# Patient Record
Sex: Female | Born: 1970 | Race: White | Hispanic: No | Marital: Married | Smoking: Never smoker
Health system: Southern US, Community
[De-identification: ages and names within clinical notes are randomized; demographics above are authoritative.]

## PROBLEM LIST (undated history)

## (undated) DIAGNOSIS — D649 Anemia, unspecified: Secondary | ICD-10-CM

## (undated) DIAGNOSIS — G43909 Migraine, unspecified, not intractable, without status migrainosus: Secondary | ICD-10-CM

## (undated) DIAGNOSIS — E079 Disorder of thyroid, unspecified: Secondary | ICD-10-CM

## (undated) HISTORY — DX: Disorder of thyroid, unspecified: E07.9

## (undated) HISTORY — DX: Anemia, unspecified: D64.9

## (undated) HISTORY — DX: Migraine, unspecified, not intractable, without status migrainosus: G43.909

---

## 2008-09-09 ENCOUNTER — Ambulatory Visit: Payer: Self-pay | Admitting: Family Medicine

## 2008-10-26 ENCOUNTER — Ambulatory Visit: Payer: Self-pay

## 2011-07-06 ENCOUNTER — Emergency Department: Payer: Self-pay | Admitting: *Deleted

## 2011-07-06 LAB — PREGNANCY, URINE: Pregnancy Test, Urine: NEGATIVE m[IU]/mL

## 2011-07-06 LAB — URINALYSIS, COMPLETE
Bilirubin,UR: NEGATIVE
Ketone: NEGATIVE
Leukocyte Esterase: NEGATIVE
Ph: 6 (ref 4.5–8.0)
Specific Gravity: 1.003 (ref 1.003–1.030)

## 2016-02-06 ENCOUNTER — Telehealth: Payer: Self-pay

## 2016-02-06 NOTE — Telephone Encounter (Signed)
Patient called to see if she can get re established and to see Dr Caryn Section. She states she use to see Dr Venia Minks and last time she was here she thinks was 2010. I do not see anything in harmony in her chart. She states her husband, Ulyses Amor sees you. She states she has nodules on thyroid and will need referral. She has Medicaid.CB: [id number]

## 2016-02-06 NOTE — Telephone Encounter (Signed)
That's fine

## 2016-02-08 NOTE — Telephone Encounter (Signed)
Appointment scheduled for 02/20/16@4 :00/MW

## 2016-02-20 ENCOUNTER — Encounter: Payer: Self-pay | Admitting: Family Medicine

## 2016-02-20 ENCOUNTER — Ambulatory Visit (INDEPENDENT_AMBULATORY_CARE_PROVIDER_SITE_OTHER): Payer: Medicaid Other | Admitting: Family Medicine

## 2016-02-20 VITALS — BP 120/70 | HR 106 | Temp 98.8°F | Resp 16 | Ht 65.5 in | Wt 122.0 lb

## 2016-02-20 DIAGNOSIS — E041 Nontoxic single thyroid nodule: Secondary | ICD-10-CM

## 2016-02-20 DIAGNOSIS — D509 Iron deficiency anemia, unspecified: Secondary | ICD-10-CM | POA: Diagnosis not present

## 2016-02-20 DIAGNOSIS — Z800 Family history of malignant neoplasm of digestive organs: Secondary | ICD-10-CM

## 2016-02-20 NOTE — Progress Notes (Signed)
       Patient: Jasmin Christian Female    DOB: 09-27-1970   46 y.o.   MRN: [id number] Visit Date: 02/20/2016  Today's Provider: Lelon Huh, MD   Chief Complaint  Patient presents with  . Establish Care   Subjective:    HPI Patient is here to establish transferring from [facility] but has recently gotten on Medicaid. She is the Spouse of [employer]. Has had thyroid  nodules since at least 2010 and had several follow up ultrasounds since then. Thyroid functions have been monitored at [facility] which have been mostly normal, but had one mildly elevated TSH last fall.   Was also found to have microcytic hypochromic anemia with hemoglobin of 7.8 on routine labs at [facility] in September. She states she has history of anemia in all of her pregnancies. She states she had been having extreme cravings for ice and was very cold natured prior to starting iron supplement in September, but these symptoms have mostly resolve. She states she is perimenopausal which she thinks ay be contributing to anemia She states her father died of colon cancer at the age of 27. She states she has had no change in bowel habits or stool consistently.   Reports last pap was 2013 and last mammogram was 2010.    No Known Allergies   Current Outpatient Prescriptions:  .  ferrous sulfate 325 (65 FE) MG tablet, Take 325 mg by mouth 3 (three) times daily with meals., Disp: , Rfl:   Review of Systems  Constitutional: Negative for appetite change, chills, fatigue and fever.  Respiratory: Negative for chest tightness and shortness of breath.   Cardiovascular: Negative for chest pain and palpitations.  Gastrointestinal: Negative for abdominal pain, nausea and vomiting.  Neurological: Negative for dizziness and weakness.    Social History  Substance Use Topics  . Smoking status: Never Smoker  . Smokeless tobacco: Never Used  . Alcohol use No   Objective:   BP 120/70 (BP Location: Left Arm, Patient Position:  Sitting, Cuff Size: Normal)   Pulse (!) 106   Temp 98.8 F (37.1 C) (Oral)   Resp 16   Ht 5' 5.5" (1.664 m)   Wt 122 lb (55.3 kg)   LMP 02/02/2016   SpO2 99%   BMI 19.99 kg/m   Physical Exam  General appearance: alert, well developed, well nourished, cooperative and in no distress Head: Normocephalic, without obvious abnormality, atraumatic Respiratory: Respirations even and unlabored, normal respiratory rate Extremities: No gross deformities Skin: Skin color, texture, turgor normal. No rashes seen  Psych: Appropriate mood and affect. Neurologic: Mental status: Alert, oriented to person, place, and time, thought content appropriate.     Assessment & Plan:     1. Iron deficiency anemia, unspecified iron deficiency anemia type Is feeling better since starting TID iron supplements, but needs GI visualization.  - Ambulatory referral to Gastroenterology - CBC  2. Family history of colon cancer  - Ambulatory referral to Gastroenterology  3. Thyroid nodule  - Ambulatory referral to Endocrinology         Lelon Huh, MD  [facility]

## 2016-02-21 LAB — CBC
Hematocrit: 38.7 % (ref 34.0–46.6)
Hemoglobin: 12.6 g/dL (ref 11.1–15.9)
MCH: 28 pg (ref 26.6–33.0)
MCHC: 32.6 g/dL (ref 31.5–35.7)
MCV: 86 fL (ref 79–97)
PLATELETS: 266 10*3/uL (ref 150–379)
RBC: 4.5 x10E6/uL (ref 3.77–5.28)
RDW: 15.2 % (ref 12.3–15.4)
WBC: 6.4 10*3/uL (ref 3.4–10.8)

## 2016-02-23 ENCOUNTER — Telehealth: Payer: Self-pay | Admitting: Family Medicine

## 2016-02-23 MED ORDER — FERROUS SULFATE 325 (65 FE) MG PO TABS: 325.0000 mg | ORAL_TABLET | Freq: Two times a day (BID) | ORAL | 3 refills | Status: DC

## 2016-02-23 NOTE — Telephone Encounter (Signed)
Please advise 

## 2016-02-23 NOTE — Telephone Encounter (Signed)
Pt wants to get a reill on the iron rx that she use to take .  She said the last time she had her iron checked Dr. Caryn Section told her to take the over the counter but she couldn't fine the right mg.  She ask that she be put back on the rx she use to take.  [facility] [address]  Pt's call back is [phone]  Thanks Con Memos

## 2016-03-11 ENCOUNTER — Telehealth: Payer: Self-pay | Admitting: Gastroenterology

## 2016-03-11 NOTE — Telephone Encounter (Signed)
Patient is returning a call regarding scheduling a colonoscopy

## 2016-03-20 ENCOUNTER — Telehealth: Payer: Self-pay

## 2016-03-20 ENCOUNTER — Other Ambulatory Visit: Payer: Self-pay

## 2016-03-20 NOTE — Telephone Encounter (Signed)
Gastroenterology Pre-Procedure Review  Request Date:  Requesting Physician: Dr.   PATIENT REVIEW QUESTIONS: The patient responded to the following health history questions as indicated:    1. Are you having any GI issues? no 2. Do you have a personal history of Polyps? no 3. Do you have a family history of Colon Cancer or Polyps? no 4. Diabetes Mellitus? no 5. Joint replacements in the past 12 months?no 6. Major health problems in the past 3 months?no 7. Any artificial heart valves, MVP, or defibrillator?no    MEDICATIONS & ALLERGIES:    Patient reports the following regarding taking any anticoagulation/antiplatelet therapy:   Plavix, Coumadin, Eliquis, Xarelto, Lovenox, Pradaxa, Brilinta, or Effient? no Aspirin? no  Patient confirms/reports the following medications:  Current Outpatient Prescriptions  Medication Sig Dispense Refill  . ferrous sulfate 325 (65 FE) MG tablet Take 1 tablet (325 mg total) by mouth 2 (two) times daily with a meal. 180 tablet 3   No current facility-administered medications for this visit.     Patient confirms/reports the following allergies:  No Known Allergies  No orders of the defined types were placed in this encounter.   AUTHORIZATION INFORMATION Primary Insurance: 1D#: Group #:  Secondary Insurance: 1D#: Group #:  SCHEDULE INFORMATION: Date: 04/09/16 Time: Location: [place]

## 2016-04-03 ENCOUNTER — Encounter: Payer: Self-pay | Admitting: Family Medicine

## 2016-04-11 ENCOUNTER — Telehealth: Payer: Self-pay | Admitting: Gastroenterology

## 2016-04-11 NOTE — Telephone Encounter (Signed)
Patient left a voice message that she needs to reschedule her colonoscopy on 3/6

## 2016-04-12 NOTE — Telephone Encounter (Signed)
LVM for pt to return my call.

## 2016-04-16 ENCOUNTER — Ambulatory Visit: Admission: RE | Admit: 2016-04-16 | Payer: Medicaid Other | Source: Ambulatory Visit | Admitting: Gastroenterology

## 2016-04-16 ENCOUNTER — Encounter: Admission: RE | Payer: Self-pay | Source: Ambulatory Visit

## 2016-04-16 ENCOUNTER — Other Ambulatory Visit: Payer: Self-pay

## 2016-04-16 DIAGNOSIS — Z800 Family history of malignant neoplasm of digestive organs: Secondary | ICD-10-CM

## 2016-04-16 SURGERY — COLONOSCOPY WITH PROPOFOL
Anesthesia: General

## 2016-04-26 ENCOUNTER — Encounter: Payer: Self-pay | Admitting: Family Medicine

## 2016-05-07 ENCOUNTER — Encounter: Payer: Self-pay | Admitting: *Deleted

## 2016-05-07 ENCOUNTER — Ambulatory Visit
Admission: RE | Admit: 2016-05-07 | Discharge: 2016-05-07 | Disposition: A | Discharge status: Home / Self Care | Payer: Medicaid Other | Source: Ambulatory Visit | Attending: Gastroenterology | Admitting: Gastroenterology

## 2016-05-07 ENCOUNTER — Ambulatory Visit: Payer: Medicaid Other | Admitting: Anesthesiology

## 2016-05-07 ENCOUNTER — Encounter
Admission: RE | Disposition: A | Discharge status: Home / Self Care | Payer: Self-pay | Source: Ambulatory Visit | Attending: Gastroenterology

## 2016-05-07 DIAGNOSIS — Z1211 Encounter for screening for malignant neoplasm of colon: Secondary | ICD-10-CM | POA: Insufficient documentation

## 2016-05-07 DIAGNOSIS — D649 Anemia, unspecified: Secondary | ICD-10-CM | POA: Insufficient documentation

## 2016-05-07 DIAGNOSIS — E079 Disorder of thyroid, unspecified: Secondary | ICD-10-CM | POA: Diagnosis not present

## 2016-05-07 DIAGNOSIS — D120 Benign neoplasm of cecum: Secondary | ICD-10-CM | POA: Insufficient documentation

## 2016-05-07 DIAGNOSIS — Z800 Family history of malignant neoplasm of digestive organs: Secondary | ICD-10-CM

## 2016-05-07 DIAGNOSIS — K641 Second degree hemorrhoids: Secondary | ICD-10-CM | POA: Insufficient documentation

## 2016-05-07 DIAGNOSIS — Z79899 Other long term (current) drug therapy: Secondary | ICD-10-CM | POA: Insufficient documentation

## 2016-05-07 DIAGNOSIS — Z86010 Personal history of colonic polyps: Secondary | ICD-10-CM | POA: Insufficient documentation

## 2016-05-07 HISTORY — PX: COLONOSCOPY WITH PROPOFOL: SHX5780

## 2016-05-07 LAB — POCT PREGNANCY, URINE: Preg Test, Ur: NEGATIVE

## 2016-05-07 SURGERY — COLONOSCOPY WITH PROPOFOL
Anesthesia: General

## 2016-05-07 MED ORDER — PROPOFOL 500 MG/50ML IV EMUL
INTRAVENOUS | Status: DC | PRN
  Administered 2016-05-07: 100 ug/kg/min via INTRAVENOUS

## 2016-05-07 MED ORDER — PHENYLEPHRINE HCL 10 MG/ML IJ SOLN
INTRAMUSCULAR | Status: DC | PRN
  Administered 2016-05-07: 100 ug via INTRAVENOUS

## 2016-05-07 MED ORDER — SODIUM CHLORIDE 0.9 % IV SOLN
INTRAVENOUS | Status: DC
  Administered 2016-05-07: 08:00:00 via INTRAVENOUS

## 2016-05-07 MED ORDER — PROPOFOL 10 MG/ML IV BOLUS
INTRAVENOUS | Status: DC | PRN
  Administered 2016-05-07: 30 mg via INTRAVENOUS
  Administered 2016-05-07: 80 mg via INTRAVENOUS

## 2016-05-07 MED ORDER — PROPOFOL 500 MG/50ML IV EMUL
INTRAVENOUS | Status: AC
Start: 2016-05-07 — End: 2016-05-07
  Filled 2016-05-07: qty 50

## 2016-05-07 NOTE — Anesthesia Postprocedure Evaluation (Signed)
Anesthesia Post Note  Patient: Jasmin Christian  Procedure(s) Performed: Procedure(s) (LRB): COLONOSCOPY WITH PROPOFOL (N/A)  Patient location during evaluation: Endoscopy Anesthesia Type: General Level of consciousness: awake and alert Pain management: pain level controlled Vital Signs Assessment: post-procedure vital signs reviewed and stable Respiratory status: spontaneous breathing, nonlabored ventilation, respiratory function stable and patient connected to nasal cannula oxygen Cardiovascular status: blood pressure returned to baseline and stable Postop Assessment: no signs of nausea or vomiting Anesthetic complications: no     Last Vitals:  Vitals:   05/07/16 0838 05/07/16 0848  BP: (!) 95/55 96/61  Pulse: 67 73  Resp: 15 14  Temp:      Last Pain:  Vitals:   05/07/16 0848  TempSrc:   PainSc: 0-No pain                 Martha Clan

## 2016-05-07 NOTE — H&P (Signed)
  Lucilla Lame, MD [facility] [address], [place], [place] [address] Phone:[phone] Fax : [phone]  Primary Care Physician:  Lelon Huh, MD Primary Gastroenterologist:  Dr. Allen Norris  Pre-Procedure History & Physical: HPI:  NELDA LUCKEY is a 46 y.o. female is here for an colonoscopy.   Past Medical History:  Diagnosis Date  . Anemia   . Migraine   . Thyroid disease     History reviewed. No pertinent surgical history.  Prior to Admission medications   Medication Sig Start Date End Date Taking? Authorizing Provider  Cholecalciferol (VITAMIN D3) 2000 units capsule Take 2,000 Units by mouth daily.   Yes Historical Provider, MD  ferrous sulfate 325 (65 FE) MG tablet Take 1 tablet (325 mg total) by mouth 2 (two) times daily with a meal. 02/23/16  Yes Birdie Sons, MD  selenium 50 MCG TABS tablet Take 50 mcg by mouth daily.   Yes Historical Provider, MD    Allergies as of 04/16/2016  . (No Known Allergies)    Family History  Problem Relation Age of Onset  . Heart disease Father   . Colon cancer Father     Social History   Social History  . Marital status: Married    Spouse name: N/A  . Number of children: N/A  . Years of education: N/A   Occupational History  . Not on file.   Social History Main Topics  . Smoking status: Never Smoker  . Smokeless tobacco: Never Used  . Alcohol use No  . Drug use: No  . Sexual activity: Not on file   Other Topics Concern  . Not on file   Social History Narrative  . No narrative on file    Review of Systems: See HPI, otherwise negative ROS  Physical Exam: BP 101/76   Pulse 86   Temp 98.6 F (37 C) (Tympanic)   Resp 16   Ht 5' 5.5" (1.664 m)   Wt 120 lb (54.4 kg)   LMP 05/05/2016   SpO2 100%   BMI 19.67 kg/m  General:   Alert,  pleasant and cooperative in NAD Head:  Normocephalic and atraumatic. Neck:  Supple; no masses or thyromegaly. Lungs:  Clear throughout to auscultation.    Heart:  Regular  rate and rhythm. Abdomen:  Soft, nontender and nondistended. Normal bowel sounds, without guarding, and without rebound.   Neurologic:  Alert and  oriented x4;  grossly normal neurologically.  Impression/Plan: BIRD TAILOR is here for an colonoscopy to be performed for family history of colon cancer  Risks, benefits, limitations, and alternatives regarding  colonoscopy have been reviewed with the patient.  Questions have been answered.  All parties agreeable.   Lucilla Lame, MD  05/07/2016, 7:47 AM

## 2016-05-07 NOTE — Op Note (Signed)
[facility] Gastroenterology Patient Name: Jasmin Christian Procedure Date: 05/07/2016 7:17 AM MRN: [id number] Account #: [id number] Date of Birth: [birth year] Admit Type: Outpatient Age: 46 Room: [facility] ENDO ROOM 4 Gender: Female Note Status: Finalized Procedure:            Colonoscopy Indications:          Screening in patient at increased risk: Family history                        of 1st-degree relative with colorectal cancer before                        age 63 years Providers:            Lucilla Lame MD, MD Medicines:            Propofol per Anesthesia Complications:        No immediate complications. Procedure:            Pre-Anesthesia Assessment:                       - Prior to the procedure, a History and Physical was                        performed, and patient medications and allergies were                        reviewed. The patient's tolerance of previous                        anesthesia was also reviewed. The risks and benefits of                        the procedure and the sedation options and risks were                        discussed with the patient. All questions were                        answered, and informed consent was obtained. Prior                        Anticoagulants: The patient has taken no previous                        anticoagulant or antiplatelet agents. ASA Grade                        Assessment: II - A patient with mild systemic disease.                        After reviewing the risks and benefits, the patient was                        deemed in satisfactory condition to undergo the                        procedure.                       After obtaining informed consent, the  colonoscope was                        passed under direct vision. Throughout the procedure,                        the patient's blood pressure, pulse, and oxygen                        saturations were monitored continuously. The Olympus                         CF-HQ190L Colonoscope (S#. [id number]) was introduced                        through the anus and advanced to the the cecum,                        identified by appendiceal orifice and ileocecal valve.                        The colonoscopy was performed without difficulty. The                        patient tolerated the procedure well. The quality of                        the bowel preparation was excellent. Findings:      The perianal and digital rectal examinations were normal.      A 5 mm polyp was found in the cecum. The polyp was sessile. The polyp       was removed with a cold snare. Resection and retrieval were complete.      Non-bleeding internal hemorrhoids were found during retroflexion. The       hemorrhoids were Grade II (internal hemorrhoids that prolapse but reduce       spontaneously). Impression:           - One 5 mm polyp in the cecum, removed with a cold                        snare. Resected and retrieved.                       - Non-bleeding internal hemorrhoids. Recommendation:       - Discharge patient to home.                       - Resume previous diet.                       - Continue present medications.                       - Repeat colonoscopy in 5 years for surveillance. Procedure Code(s):    --- Professional ---                       [phone], Colonoscopy, flexible; with removal of tumor(s),                        polyp(s), or other lesion(s) by snare technique Diagnosis Code(s):    ---  Professional ---                       Z80.0, Family history of malignant neoplasm of                        digestive organs                       D12.0, Benign neoplasm of cecum CPT copyright 2016 American Medical Association. All rights reserved. The codes documented in this report are preliminary and upon coder review may  be revised to meet current compliance requirements. Lucilla Lame MD, MD 05/07/2016 8:14:30 AM This report has been signed  electronically. Number of Addenda: 0 Note Initiated On: 05/07/2016 7:17 AM Scope Withdrawal Time: 0 hours 7 minutes 5 seconds  Total Procedure Duration: 0 hours 14 minutes 13 seconds       [facility]

## 2016-05-07 NOTE — Anesthesia Post-op Follow-up Note (Cosign Needed)
Anesthesia QCDR form completed.        

## 2016-05-07 NOTE — Anesthesia Preprocedure Evaluation (Signed)
Anesthesia Evaluation  Patient identified by MRN, date of birth, ID band Patient awake    Reviewed: Allergy & Precautions, H&P , NPO status , Patient's Chart, lab work & pertinent test results, reviewed documented beta blocker date and time   History of Anesthesia Complications Negative for: history of anesthetic complications  Airway Mallampati: II  TM Distance: >3 FB Neck ROM: full    Dental   Bridge:   Pulmonary neg pulmonary ROS,           Cardiovascular Exercise Tolerance: Good negative cardio ROS       Neuro/Psych negative neurological ROS  negative psych ROS   GI/Hepatic negative GI ROS, Neg liver ROS,   Endo/Other  neg diabetesHypothyroidism   Renal/GU negative Renal ROS  negative genitourinary   Musculoskeletal   Abdominal   Peds  Hematology  (+) anemia ,   Anesthesia Other Findings Past Medical History: No date: Anemia No date: Migraine No date: Thyroid disease   Reproductive/Obstetrics negative OB ROS                             Anesthesia Physical Anesthesia Plan  ASA: II  Anesthesia Plan: General   Post-op Pain Management:    Induction:   Airway Management Planned:   Additional Equipment:   Intra-op Plan:   Post-operative Plan:   Informed Consent: I have reviewed the patients History and Physical, chart, labs and discussed the procedure including the risks, benefits and alternatives for the proposed anesthesia with the patient or authorized representative who has indicated his/her understanding and acceptance.   Dental Advisory Given  Plan Discussed with: Anesthesiologist, CRNA and Surgeon  Anesthesia Plan Comments:         Anesthesia Quick Evaluation

## 2016-05-07 NOTE — Transfer of Care (Signed)
Immediate Anesthesia Transfer of Care Note  Patient: Jasmin Christian  Procedure(s) Performed: Procedure(s): COLONOSCOPY WITH PROPOFOL (N/A)  Patient Location: PACU and Endoscopy Unit  Anesthesia Type:General  Level of Consciousness: awake, alert  and oriented  Airway & Oxygen Therapy: Patient Spontanous Breathing and Patient connected to nasal cannula oxygen  Post-op Assessment: Report given to RN and Post -op Vital signs reviewed and stable  Post vital signs: Reviewed and stable  Last Vitals:  Vitals:   05/07/16 0818 05/07/16 0819  BP: (!) 102/44 (!) 102/44  Pulse: 63 66  Resp: 15 16  Temp: 36.2 C     Last Pain:  Vitals:   05/07/16 0818  TempSrc: Tympanic         Complications: No apparent anesthesia complications

## 2016-05-08 ENCOUNTER — Encounter: Payer: Self-pay | Admitting: Family Medicine

## 2016-05-08 LAB — SURGICAL PATHOLOGY

## 2016-05-09 ENCOUNTER — Encounter: Payer: Self-pay | Admitting: Gastroenterology

## 2018-12-04 ENCOUNTER — Telehealth: Payer: Self-pay

## 2018-12-04 NOTE — Telephone Encounter (Signed)
Pre-visit screening call attempted prior to [facility] appointment on 12/08/2018. No answer / left a msg

## 2018-12-08 ENCOUNTER — Ambulatory Visit
Admission: RE | Admit: 2018-12-08 | Discharge: 2018-12-08 | Disposition: A | Discharge status: Home / Self Care | Payer: Medicaid Other | Source: Ambulatory Visit | Attending: Oncology | Admitting: Oncology

## 2018-12-08 ENCOUNTER — Ambulatory Visit: Payer: Medicaid Other | Attending: Oncology | Admitting: *Deleted

## 2018-12-08 ENCOUNTER — Other Ambulatory Visit: Payer: Self-pay

## 2018-12-08 VITALS — BP 100/68 | HR 72 | Temp 98.3°F | Ht 65.0 in | Wt 117.0 lb

## 2018-12-08 DIAGNOSIS — Z0000 Encounter for general adult medical examination without abnormal findings: Secondary | ICD-10-CM

## 2018-12-08 NOTE — Progress Notes (Signed)
  Subjective:     Patient ID: Jasmin Christian, female   DOB: 05/14/1970, 48 y.o.   MRN: [id number]  HPI   Review of Systems     Objective:   Physical Exam Chest:     Breasts:        Right: No swelling, bleeding, inverted nipple, mass, nipple discharge, skin change or tenderness.        Left: No swelling, bleeding, inverted nipple, mass, nipple discharge, skin change or tenderness.  Lymphadenopathy:     Upper Body:     Right upper body: No supraclavicular or axillary adenopathy.     Left upper body: No supraclavicular or axillary adenopathy.        Assessment:     48 year old White female presents to [facility] for clinical breast exam and mammogram.  Clinical breast exam unremarkable.  Taught self breast awareness.  Patient had recent pap on 11/03/18 that was negative without HPV co-testing.  Tyrer-Cuzick breast cancer risk assessment with a lifetime risk of 7.4%.  Per NCCN guidelines no further imaging or genetic testing is recommended.  Patient does meet NCCN guidelines for genetic testing based on family history of her colon cancer in her dad at age 37.  Discussed referral to high risk clinic for genetic testing.  States she cannot afford it at this time, although she is interested.  Discussed possibility of financial assistance through our [facility]  We are going to discuss with the Foundation.  Patient has been screened for eligibility.  She does not have any insurance, Medicare or Medicaid.  She also meets financial eligibility.  Hand-out given on the Affordable Care Act.     Plan:     Screening mammogram ordered.  Patient was given my number to follow back up in a couple of weeks to see if financial assistance is available.  Will follow up per [facility] protocol.

## 2018-12-08 NOTE — Patient Instructions (Signed)
Patient has been screened for eligibility.  She does not have any insurance, Medicare or Medicaid.  She also meets financial eligibility.  Hand-out given on the Affordable Care Act.

## 2018-12-09 ENCOUNTER — Encounter: Payer: Self-pay | Admitting: *Deleted

## 2018-12-10 ENCOUNTER — Encounter: Payer: Self-pay | Admitting: *Deleted

## 2020-06-04 ENCOUNTER — Emergency Department: Payer: Medicaid Other

## 2020-06-04 ENCOUNTER — Other Ambulatory Visit: Payer: Self-pay

## 2020-06-04 ENCOUNTER — Emergency Department
Admission: EM | Admit: 2020-06-04 | Discharge: 2020-06-04 | Disposition: A | Discharge status: Home / Self Care | Payer: Medicaid Other | Attending: Emergency Medicine | Admitting: Emergency Medicine

## 2020-06-04 DIAGNOSIS — R451 Restlessness and agitation: Secondary | ICD-10-CM | POA: Insufficient documentation

## 2020-06-04 DIAGNOSIS — F12929 Cannabis use, unspecified with intoxication, unspecified: Secondary | ICD-10-CM

## 2020-06-04 DIAGNOSIS — R000 Tachycardia, unspecified: Secondary | ICD-10-CM | POA: Diagnosis not present

## 2020-06-04 DIAGNOSIS — E079 Disorder of thyroid, unspecified: Secondary | ICD-10-CM | POA: Insufficient documentation

## 2020-06-04 DIAGNOSIS — F12129 Cannabis abuse with intoxication, unspecified: Secondary | ICD-10-CM | POA: Insufficient documentation

## 2020-06-04 DIAGNOSIS — T7840XA Allergy, unspecified, initial encounter: Secondary | ICD-10-CM | POA: Diagnosis not present

## 2020-06-04 DIAGNOSIS — R0602 Shortness of breath: Secondary | ICD-10-CM | POA: Insufficient documentation

## 2020-06-04 DIAGNOSIS — R457 State of emotional shock and stress, unspecified: Secondary | ICD-10-CM | POA: Diagnosis not present

## 2020-06-04 LAB — COMPREHENSIVE METABOLIC PANEL
ALT: 21 U/L (ref 0–44)
AST: 23 U/L (ref 15–41)
Albumin: 3.6 g/dL (ref 3.5–5.0)
Alkaline Phosphatase: 69 U/L (ref 38–126)
Anion gap: 9 (ref 5–15)
BUN: 12 mg/dL (ref 6–20)
CO2: 26 mmol/L (ref 22–32)
Calcium: 8.6 mg/dL — ABNORMAL LOW (ref 8.9–10.3)
Chloride: 104 mmol/L (ref 98–111)
Creatinine, Ser: 0.81 mg/dL (ref 0.44–1.00)
GFR, Estimated: >60 mL/min (ref >60)
Glucose, Bld: 161 mg/dL — ABNORMAL HIGH (ref 70–99)
Potassium: 3.5 mmol/L (ref 3.5–5.1)
Sodium: 139 mmol/L (ref 135–145)
Total Bilirubin: 0.5 mg/dL (ref 0.3–1.2)
Total Protein: 6.4 g/dL — ABNORMAL LOW (ref 6.5–8.1)

## 2020-06-04 LAB — CBC WITH DIFFERENTIAL/PLATELET
Abs Immature Granulocytes: 0.02 10*3/uL (ref 0.00–0.07)
Basophils Absolute: 0 10*3/uL (ref 0.0–0.1)
Basophils Relative: 0 %
Eosinophils Absolute: 0.1 10*3/uL (ref 0.0–0.5)
Eosinophils Relative: 2 %
HCT: 36.4 % (ref 36.0–46.0)
Hemoglobin: 11.9 g/dL — ABNORMAL LOW (ref 12.0–15.0)
Immature Granulocytes: 0 %
Lymphocytes Relative: 34 %
Lymphs Abs: 1.9 10*3/uL (ref 0.7–4.0)
MCH: 28.7 pg (ref 26.0–34.0)
MCHC: 32.7 g/dL (ref 30.0–36.0)
MCV: 87.7 fL (ref 80.0–100.0)
Monocytes Absolute: 0.5 10*3/uL (ref 0.1–1.0)
Monocytes Relative: 10 %
Neutro Abs: 2.9 10*3/uL (ref 1.7–7.7)
Neutrophils Relative %: 54 %
Platelets: 249 10*3/uL (ref 150–400)
RBC: 4.15 MIL/uL (ref 3.87–5.11)
RDW: 12.8 % (ref 11.5–15.5)
WBC: 5.5 10*3/uL (ref 4.0–10.5)
nRBC: 0 % (ref 0.0–0.2)

## 2020-06-04 LAB — URINE DRUG SCREEN, QUALITATIVE (ARMC ONLY)
Amphetamines, Ur Screen: NOT DETECTED
Barbiturates, Ur Screen: NOT DETECTED
Benzodiazepine, Ur Scrn: NOT DETECTED
Cannabinoid 50 Ng, Ur ~~LOC~~: POSITIVE — AB
Cocaine Metabolite,Ur ~~LOC~~: NOT DETECTED
MDMA (Ecstasy)Ur Screen: NOT DETECTED
Methadone Scn, Ur: NOT DETECTED
Opiate, Ur Screen: NOT DETECTED
Phencyclidine (PCP) Ur S: NOT DETECTED
Tricyclic, Ur Screen: NOT DETECTED

## 2020-06-04 LAB — ETHANOL: Alcohol, Ethyl (B): <10 mg/dL (ref <10)

## 2020-06-04 LAB — TROPONIN I (HIGH SENSITIVITY): Troponin I (High Sensitivity): <2 ng/L (ref <18)

## 2020-06-04 MED ORDER — LORAZEPAM 2 MG/ML IJ SOLN
1.0000 mg | Freq: Once | INTRAMUSCULAR | Status: AC
  Administered 2020-06-04: 1 mg via INTRAVENOUS
  Filled 2020-06-04: qty 1

## 2020-06-04 MED ORDER — SODIUM CHLORIDE 0.9 % IV BOLUS (SEPSIS)
1000.0000 mL | Freq: Once | INTRAVENOUS | Status: AC
  Administered 2020-06-04: 1000 mL via INTRAVENOUS

## 2020-06-04 NOTE — ED Provider Notes (Signed)
[facility] Emergency Department Provider Note  ____________________________________________   Event Date/Time   First MD Initiated Contact with Patient 06/04/20 0601     (approximate)  I have reviewed the triage vital signs and the nursing notes.   HISTORY  Chief Complaint Anxiety    HPI Jasmin Christian is a 50 y.o. female with history of migraines, thyroid disease who presents to the emergency department with complaints of feeling short of breath, dry mouth, restless, "electric jolts" throughout her body that started after taking one of her husbands CBD Gummies.  She states that he buys them from a store in [place] and she took 1 tonight for the first time because he told her they would help her sleep.  She also took melatonin which she has taken before.  She takes other over-the-counter vitamins and minerals but is not on any prescription medications.  She did not take any of these things to try to harm her self.  No other illicit drug use, alcohol use.  No current chest pain.  No fevers, cough, vomiting or diarrhea.  Has had some nausea.       Past Medical History:  Diagnosis Date  . Anemia   . Migraine   . Thyroid disease     Patient Active Problem List   Diagnosis Date Noted  . History of adenomatous polyp of colon 05/07/2016  . Family history of malignant neoplasm of gastrointestinal tract   . Family history of colon cancer 02/20/2016  . Thyroid nodule 02/20/2016    Past Surgical History:  Procedure Laterality Date  . COLONOSCOPY WITH PROPOFOL N/A 05/07/2016   Procedure: COLONOSCOPY WITH PROPOFOL;  Surgeon: Lucilla Lame, MD;  Location: [facility] ENDOSCOPY;  Service: Endoscopy;  Laterality: N/A;    Prior to Admission medications   Medication Sig Start Date End Date Taking? Authorizing Provider  Melatonin 10 MG TABS Take by mouth.   Yes [provider]  Cholecalciferol (VITAMIN D3) 2000 units capsule Take 2,000 Units by mouth daily.     [provider]  ferrous sulfate 325 (65 FE) MG tablet Take 1 tablet (325 mg total) by mouth 2 (two) times daily with a meal. 02/23/16   Birdie Sons, MD  selenium 50 MCG TABS tablet Take 50 mcg by mouth daily.    [provider]    Allergies Patient has no known allergies.  Family History  Problem Relation Age of Onset  . Heart disease Father   . Colon cancer Father   . Breast cancer Neg Hx     Social History Social History   Tobacco Use  . Smoking status: Never Smoker  . Smokeless tobacco: Never Used  Substance Use Topics  . Alcohol use: No  . Drug use: No    Review of Systems Constitutional: No fever. Eyes: No visual changes. ENT: No sore throat. Cardiovascular: Denies chest pain. Respiratory: + shortness of breath. Gastrointestinal: No vomiting, diarrhea. Genitourinary: Negative for dysuria. Musculoskeletal: Negative for back pain. Skin: Negative for rash. Neurological: Negative for focal weakness or numbness.  ____________________________________________   PHYSICAL EXAM:  VITAL SIGNS: ED Triage Vitals  Enc Vitals Group     BP 06/04/20 0600 119/69     Pulse Rate 06/04/20 0600 (!) 127     Resp 06/04/20 0600 18     Temp 06/04/20 0600 98.4 F (36.9 C)     Temp Source 06/04/20 0600 Oral     SpO2 06/04/20 0600 98 %  Weight 06/04/20 0558 114 lb 10.2 oz (52 kg)     Height 06/04/20 0558 5\' 5"  (1.651 m)     Head Circumference --      Peak Flow --      Pain Score --      Pain Loc --      Pain Edu? --      Excl. in [place]? --    CONSTITUTIONAL: Alert and oriented and responds appropriately to questions. Well-appearing; well-nourished, appears anxious HEAD: Normocephalic EYES: Conjunctivae clear, pupils appear equal, EOM appear intact ENT: normal nose; dry mucous membranes NECK: Supple, normal ROM CARD: Regular and tachycardic; S1 and S2 appreciated; no murmurs, no clicks, no rubs, no gallops RESP: Normal chest excursion without  splinting or tachypnea; breath sounds clear and equal bilaterally; no wheezes, no rhonchi, no rales, no hypoxia or respiratory distress, speaking full sentences ABD/GI: Normal bowel sounds; non-distended; soft, non-tender, no rebound, no guarding, no peritoneal signs, no hepatosplenomegaly BACK: The back appears normal EXT: Normal ROM in all joints; no deformity noted, no edema; no cyanosis SKIN: Normal color for age and race; warm; no rash on exposed skin NEURO: Moves all extremities equally, clear speech, no aphasia, no facial asymmetry, appears restless, no asterixis or clonus PSYCH: The patient's mood and manner are appropriate.  ____________________________________________   LABS (all labs ordered are listed, but only abnormal results are displayed)  Labs Reviewed  CBC WITH DIFFERENTIAL/PLATELET - Abnormal; Notable for the following components:      Result Value   Hemoglobin 11.9 (*)    All other components within normal limits  COMPREHENSIVE METABOLIC PANEL - Abnormal; Notable for the following components:   Glucose, Bld 161 (*)    Calcium 8.6 (*)    Total Protein 6.4 (*)    All other components within normal limits  URINE DRUG SCREEN, QUALITATIVE ([facility] ONLY) - Abnormal; Notable for the following components:   Cannabinoid 50 Ng, [place] POSITIVE (*)    All other components within normal limits  ETHANOL  TROPONIN I (HIGH SENSITIVITY)   ____________________________________________  EKG   EKG Interpretation  Date/Time:  Sunday June 04 2020 06:11:49 EDT Ventricular Rate:  103 PR Interval:  133 QRS Duration: 95 QT Interval:  343 QTC Calculation: 449 R Axis:   51 Text Interpretation: Sinus tachycardia RSR' in V1 or V2, right VCD or RVH Confirmed by Pryor Curia [phone]) on 06/04/2020 6:17:31 AM       ____________________________________________  RADIOLOGY Jessie Foot Dejion Grillo, personally viewed and evaluated these images (plain radiographs) as part of my medical decision  making, as well as reviewing the written report by the radiologist.  ED MD interpretation:  pending  Official radiology report(s): No results found.  ____________________________________________   PROCEDURES  Procedure(s) performed (including Critical Care):  Procedures   ____________________________________________   INITIAL IMPRESSION / ASSESSMENT AND PLAN / ED COURSE  As part of my medical decision making, I reviewed the following data within the electronic MEDICAL RECORD NUMBER History obtained from family, Nursing notes reviewed and incorporated, Labs reviewed , EKG interpreted , Old chart reviewed, Patient signed out to Dr. Tamala Julian and Notes from prior ED visits         Patient here with possible acute intoxication from marijuana after ingesting what she was told was CBD Gummies.  She appears anxious, restless.  Has dry mucous membranes on exam.  Will give IV Ativan, IV fluids.  EKG shows sinus tachycardia without ischemia.  Will check troponin given she was  complaining of shortness of breath and nausea but this seems to have improved.  No chest pain.  Basic labs also pending.  Will monitor until clinically sober.  Ethanol level, urine drug screen pending.  ED PROGRESS  Patient now resting comfortably after IV Ativan.  Heart rate has improved.  Husband at bedside states that he gave the patient a delta 8 gummy tonight.  Her urine drug screen is positive for cannabinoids.  Patient's labs unremarkable including normal troponin.  Ethanol level negative.  She is positive for cannabinoids.  Signed out to Dr. Tamala Julian to follow-up on patient's chest x-ray and reassess once clinically sober.  Husband at bedside has been updated.   I reviewed all nursing notes and pertinent previous records as available.  I have reviewed and interpreted any EKGs, lab and urine results, imaging (as available).  ____________________________________________   FINAL CLINICAL IMPRESSION(S) / ED  DIAGNOSES  Final diagnoses:  Marijuana intoxication, with unspecified complication [facility])     ED Discharge Orders    None      *Please note:  MIKELL CAMP was evaluated in Emergency Department on 06/04/2020 for the symptoms described in the history of present illness. She was evaluated in the context of the global COVID-19 pandemic, which necessitated consideration that the patient might be at risk for infection with the SARS-CoV-2 virus that causes COVID-19. Institutional protocols and algorithms that pertain to the evaluation of patients at risk for COVID-19 are in a state of rapid change based on information released by regulatory bodies including the CDC and federal and state organizations. These policies and algorithms were followed during the patient's care in the ED.  Some ED evaluations and interventions may be delayed as a result of limited staffing during and the pandemic.*   Note:  This document was prepared using Dragon voice recognition software and may include unintentional dictation errors.   Min Tunnell, Delice Bison, DO 06/04/20 [phone]

## 2020-06-04 NOTE — ED Provider Notes (Signed)
  Clinical Course as of 06/04/20 1215  Sun Jun 04, 2020  0809 Patient received in signout from Dr. Leonides Schanz at 7 AM. I go reevaluate the patient and she awakens to me entering the room and introducing myself to her husband.  She reports still feeling groggy.  Remains hemodynamically stable with heart rate in the 90s.  We discussed continued observation time prior to likely outpatient management.  They are agreeable.  No needs. [DS]  5035 Reassessed.  Patient sitting up in bed and looks well.  We discussed abstinence from delta 8 THC and other recreational drugs.  We discussed return precautions for the ED.  Answered questions. [DS]    Clinical Course User Index [DS] Vladimir Crofts, MD      Vladimir Crofts, MD 06/04/20 [phone]

## 2020-06-04 NOTE — ED Notes (Signed)
Pt assisted to bathroom with steady gait

## 2020-06-04 NOTE — ED Notes (Signed)
Pt ambulated to bathroom, reports still feeling very tense and unsteady.  Pt returned to bed without incident.  Remains on continuous monitoring.

## 2020-06-04 NOTE — ED Notes (Signed)
Pt sitting up in bed, talking on phone. States she is still feeling some sleepy,   AAO x 4.  Family at bedside.  Remains on continuous monitoring until dispo.

## 2020-06-04 NOTE — ED Notes (Signed)
IV catheter removed intact without complication.  D/C instructions given.  Pt instructed on follow up.  Pt instructed not to drive.  All questions addressed.  Understanding verbalized.  Pt left ER via w/c with family.

## 2020-06-04 NOTE — ED Notes (Signed)
Pt resting with eyes closed.  States she feels very tense, otherwise is ok.  Pt on continuous monitoring, family at bedside.

## 2020-06-04 NOTE — ED Triage Notes (Addendum)
Ate gummy x1 and 10mg  melatonin, ems gave 116/82, hr 120, 98RA. C/o restlessness, never had gummy before

## 2021-07-03 IMAGING — MG DIGITAL SCREENING BILAT W/ TOMO
8 series · 9 of 24 positions shown · non-contrast
Comparison: Previous exam(s).

CLINICAL DATA: Screening.

EXAM:
DIGITAL SCREENING BILATERAL MAMMOGRAM WITH TOMO AND CAD

[R MLO synth-2D]
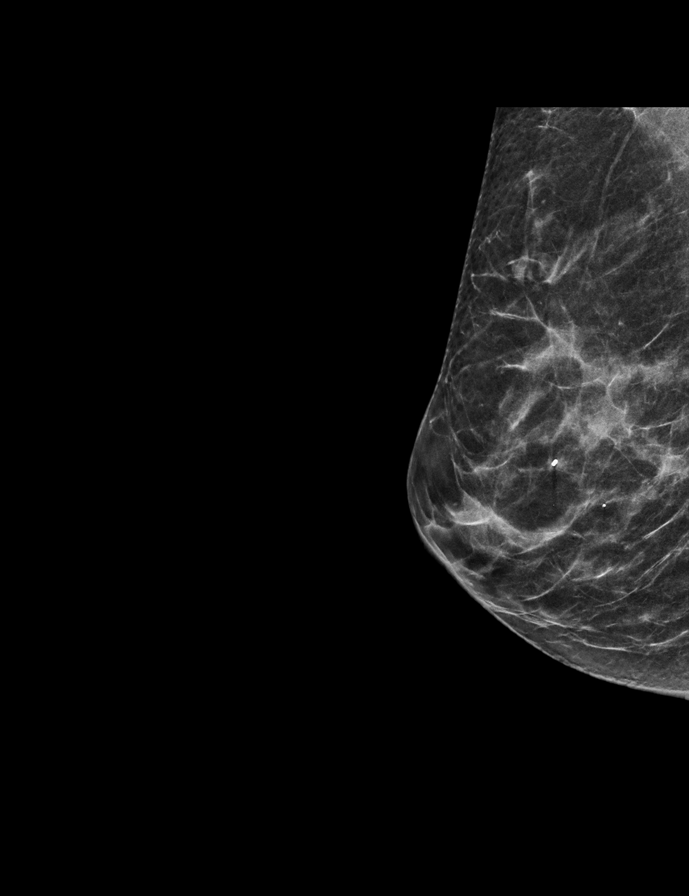

[L CC synth-2D]
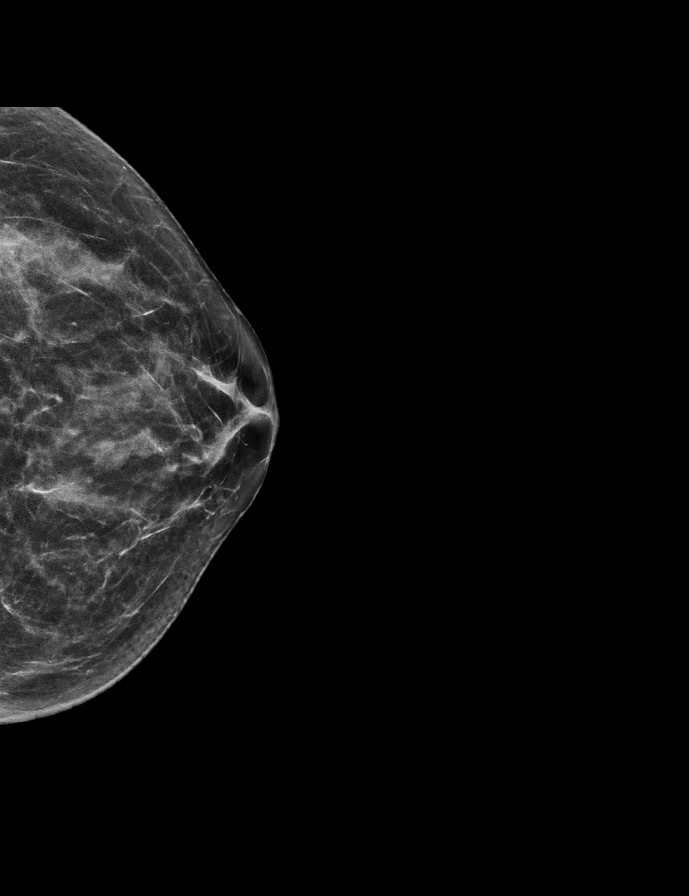

[R CC synth-2D]
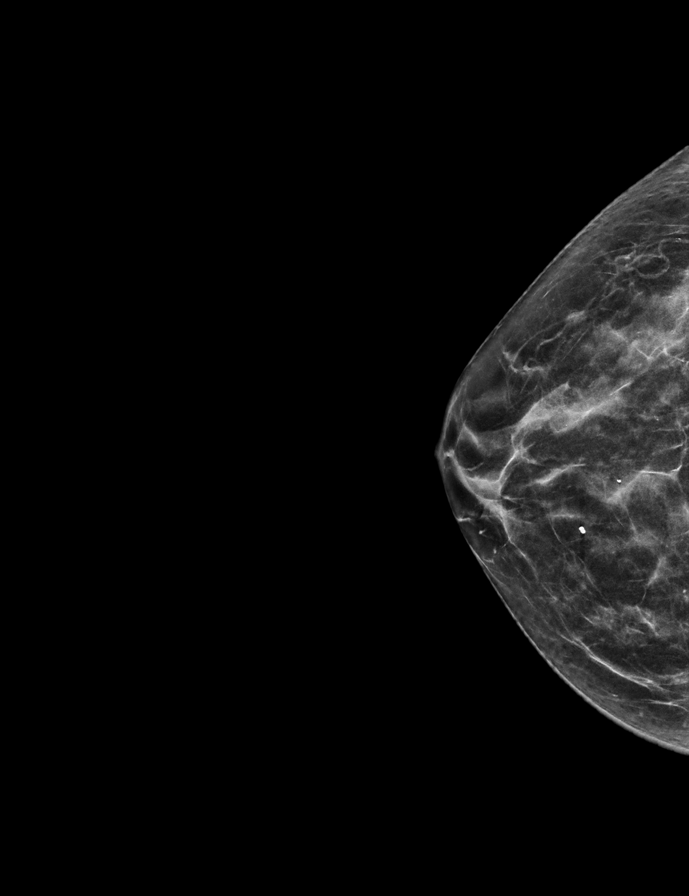

[L MLO synth-2D]
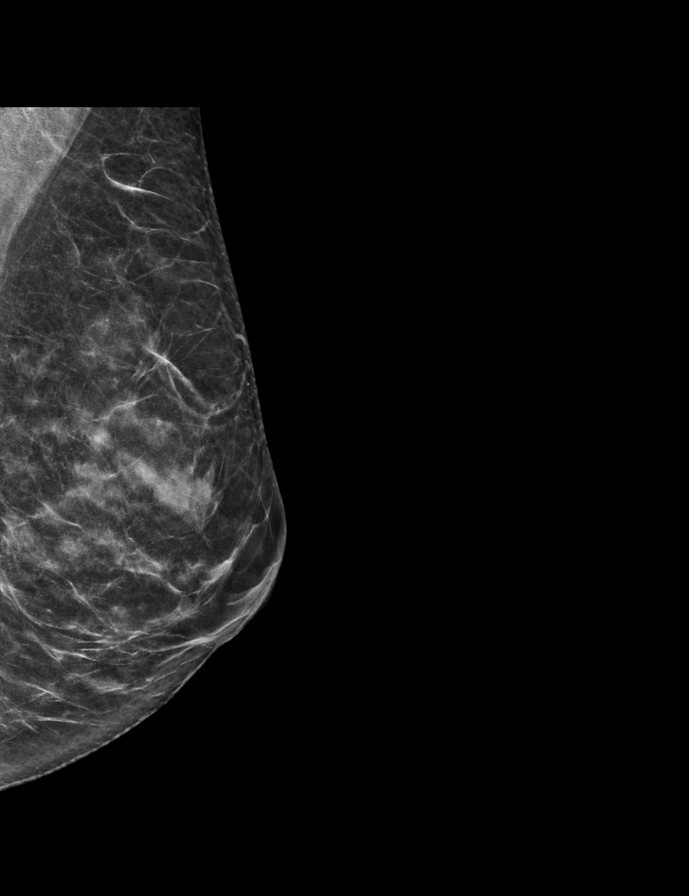

[R CC tomo · 2 of 50 frames shown]
[frame 17/50]
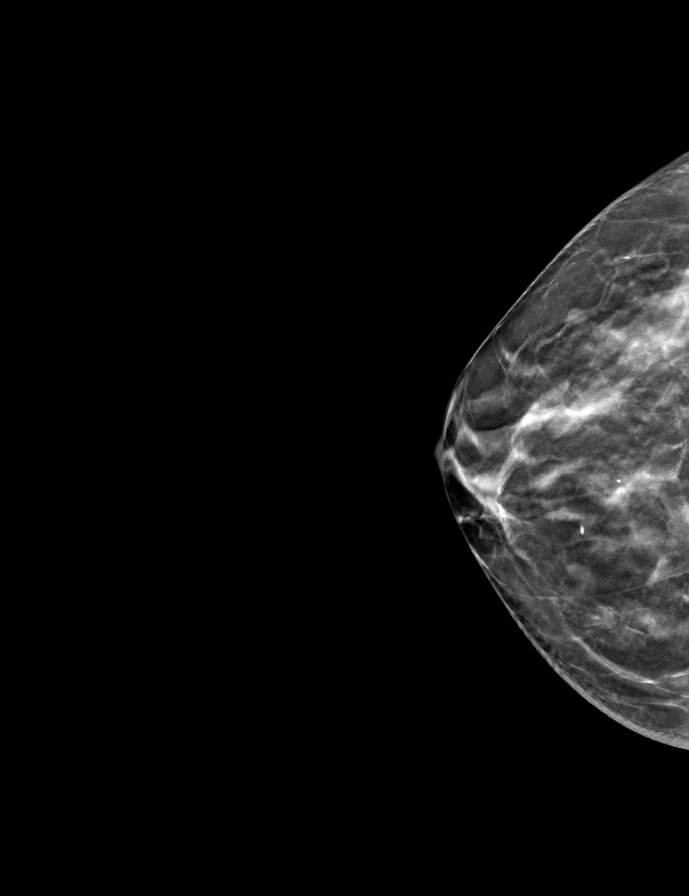
[frame 25/50]
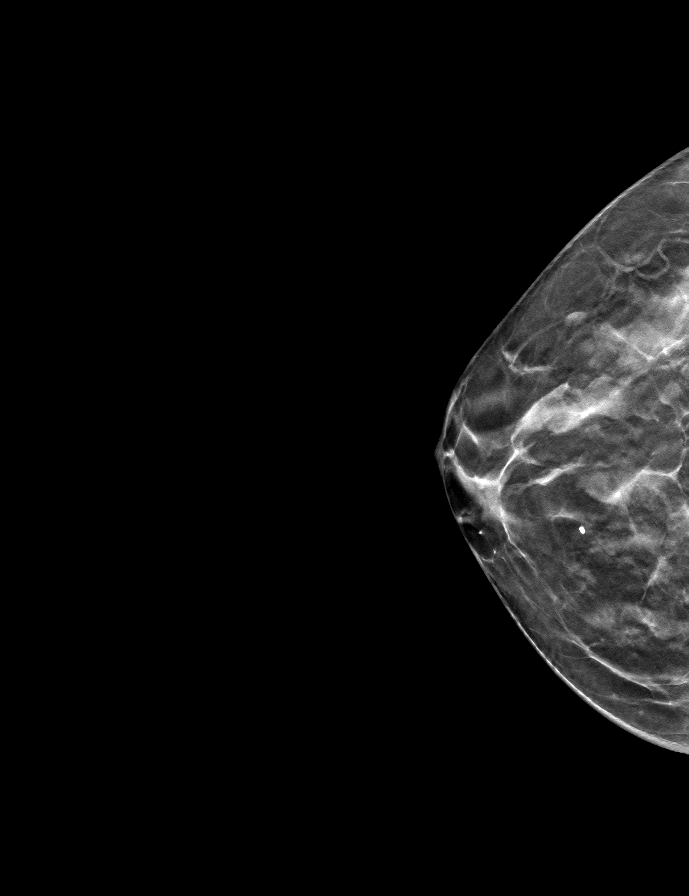

[R MLO tomo · tomo slice 25/50.0]
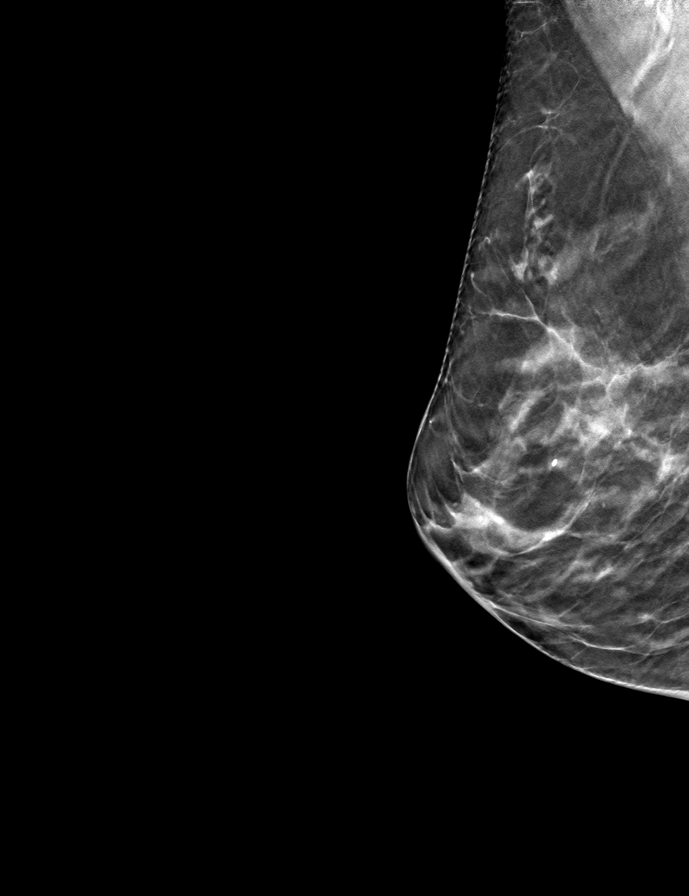

[L MLO tomo · tomo slice 29/56.0]
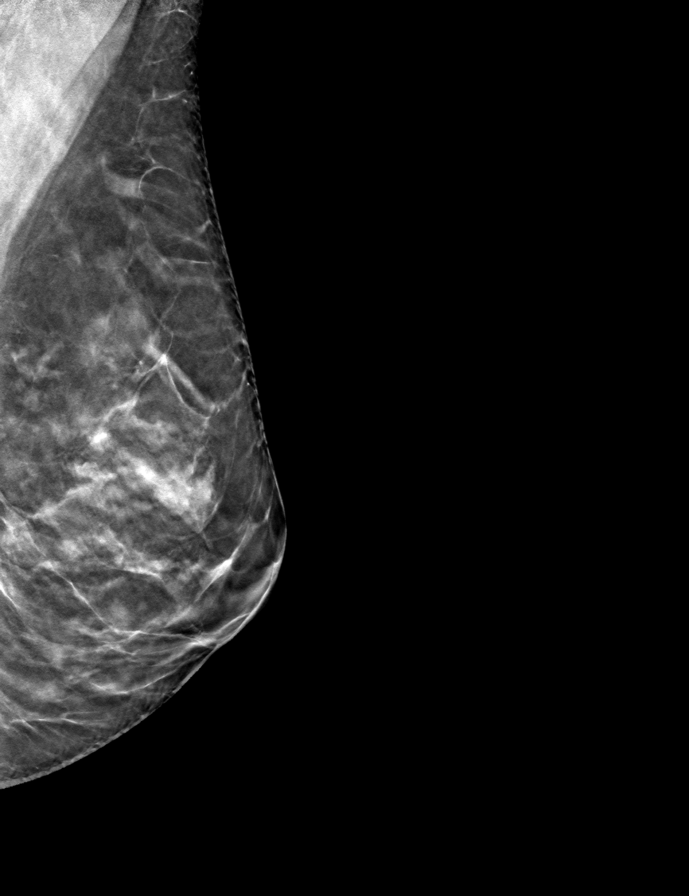

[L CC tomo · tomo slice 27/54.0]
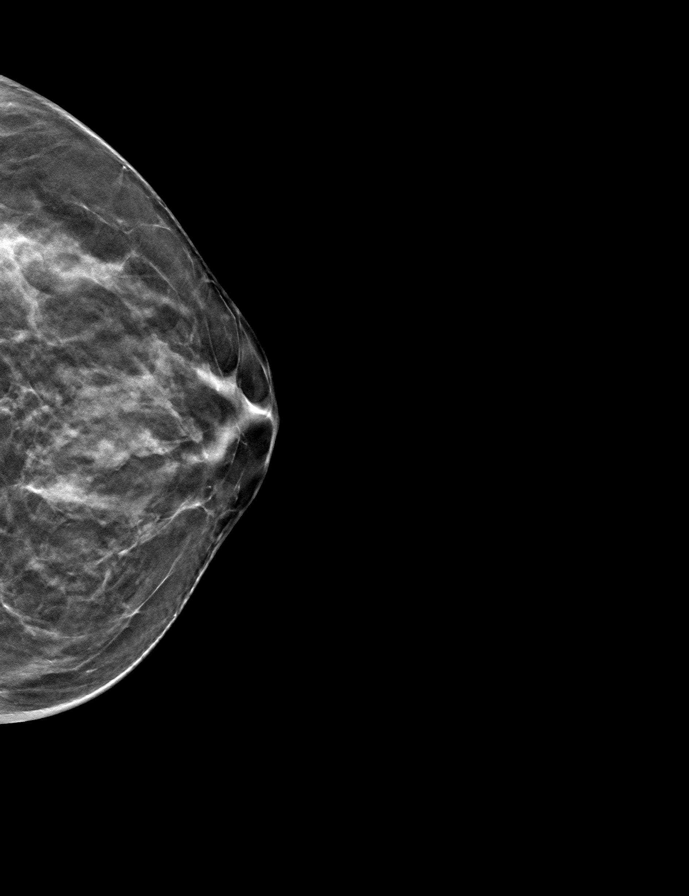

[9 of 24 positions shown; findings below may reference images not displayed]

ACR Breast Density Category c: The breast tissue is heterogeneously
dense, which may obscure small masses.
FINDINGS: There are no findings suspicious for malignancy. Images were
processed with CAD.
IMPRESSION: No mammographic evidence of malignancy. A result letter of this
screening mammogram will be mailed directly to the patient.

RECOMMENDATION:
Screening mammogram in one year. (Code:FT-U-LHB)

BI-RADS CATEGORY  1: Negative.

## 2022-01-11 DIAGNOSIS — Z419 Encounter for procedure for purposes other than remedying health state, unspecified: Secondary | ICD-10-CM | POA: Diagnosis not present

## 2022-02-11 DIAGNOSIS — Z419 Encounter for procedure for purposes other than remedying health state, unspecified: Secondary | ICD-10-CM | POA: Diagnosis not present

## 2022-02-18 NOTE — Progress Notes (Unsigned)
New patient visit   Patient: Jasmin Christian   DOB: 05/11/1970   52 y.o. Female  MRN: [id number] Visit Date: 02/19/2022  Today's healthcare provider: [name], MD   No chief complaint on file.  Subjective    Jasmin Christian is a 52 y.o. female who presents today as a new patient to establish care.  HPI   New Patient  Patient presents visit to establish care with new primary care physician Introduced myself and my role as primary We reviewed patient's medical, surgical, medications and social history additional problems were discussed as detailed below   Past Medical History:  Diagnosis Date   Anemia    Migraine    Thyroid disease    Past Surgical History:  Procedure Laterality Date   COLONOSCOPY WITH PROPOFOL N/A 05/07/2016   Procedure: COLONOSCOPY WITH PROPOFOL;  Surgeon: Lucilla Lame, MD;  Location: [facility] ENDOSCOPY;  Service: Endoscopy;  Laterality: N/A;   Family Status  Relation Name Status   Mother  Alive   Father  Deceased at age 21   Neg Hx  (Not Specified)   Family History  Problem Relation Age of Onset   Heart disease Father    Colon cancer Father    Breast cancer Neg Hx    Social History   Socioeconomic History   Marital status: Married    Spouse name: Not on file   Number of children: Not on file   Years of education: Not on file   Highest education level: Not on file  Occupational History   Not on file  Tobacco Use   Smoking status: Never   Smokeless tobacco: Never  Substance and Sexual Activity   Alcohol use: No   Drug use: No   Sexual activity: Not on file  Other Topics Concern   Not on file  Social History Narrative   Not on file   Social Determinants of Health   Financial Resource Strain: Not on file  Food Insecurity: Not on file  Transportation Needs: Not on file  Physical Activity: Not on file  Stress: Not on file  Social Connections: Not on file   Outpatient Medications Prior to Visit  Medication Sig    Cholecalciferol (VITAMIN D3) 2000 units capsule Take 2,000 Units by mouth daily.   ferrous sulfate 325 (65 FE) MG tablet Take 1 tablet (325 mg total) by mouth 2 (two) times daily with a meal.   Melatonin 10 MG TABS Take by mouth.   selenium 50 MCG TABS tablet Take 50 mcg by mouth daily.   No facility-administered medications prior to visit.   No Known Allergies   There is no immunization history on file for this patient.  Health Maintenance  Topic Date Due   HIV Screening  Never done   Hepatitis C Screening  Never done   DTaP/Tdap/Td (1 - Tdap) Never done   PAP SMEAR-Modifier  12/13/2014   Zoster Vaccines- Shingrix (1 of 2) Never done   MAMMOGRAM  12/07/2020   COLONOSCOPY (Pts 45-62yr Insurance coverage will need to be confirmed)  05/07/2021   INFLUENZA VACCINE  Never done   HPV VACCINES  Aged Out    Patient Care Team: LKerri Perches PHershal Coriaas PCP - General (Physician Assistant) LRico Junker RN as Registered Nurse STheodore Demark RN (Inactive) as Registered Nurse  Review of Systems  {Labs  Heme  Chem  Endocrine  Serology  Results Review (optional):23779}   Objective    LMP 05/05/2016  {  Show previous vital signs (optional):23777}  Physical Exam ***  Depression Screen     No data to display         No results found for any visits on 02/19/22.  Assessment & Plan      Problem List Items Addressed This Visit   None    No follow-ups on file.     I, [name], MD, have reviewed all documentation for this visit.  Portions of this information were initially documented by the CMA and reviewed by me for thoroughness and accuracy.      [name], MD  [facility] [phone] (phone) [phone] (fax)  [place]

## 2022-02-19 ENCOUNTER — Ambulatory Visit (INDEPENDENT_AMBULATORY_CARE_PROVIDER_SITE_OTHER): Payer: Medicaid Other | Admitting: Family Medicine

## 2022-02-19 ENCOUNTER — Encounter: Payer: Self-pay | Admitting: Family Medicine

## 2022-02-19 VITALS — BP 104/82 | HR 82 | Temp 98.3°F | Wt 124.6 lb

## 2022-02-19 DIAGNOSIS — E042 Nontoxic multinodular goiter: Secondary | ICD-10-CM | POA: Diagnosis not present

## 2022-02-19 DIAGNOSIS — Z0000 Encounter for general adult medical examination without abnormal findings: Secondary | ICD-10-CM | POA: Insufficient documentation

## 2022-02-19 DIAGNOSIS — Z1211 Encounter for screening for malignant neoplasm of colon: Secondary | ICD-10-CM

## 2022-02-19 DIAGNOSIS — Z800 Family history of malignant neoplasm of digestive organs: Secondary | ICD-10-CM | POA: Diagnosis not present

## 2022-02-19 DIAGNOSIS — N951 Menopausal and female climacteric states: Secondary | ICD-10-CM

## 2022-02-19 NOTE — Assessment & Plan Note (Signed)
Patient request GYN referral for menopausal symptoms that have been present for 4 years  Referral to [facility] submitted

## 2022-02-19 NOTE — Assessment & Plan Note (Signed)
Colonoscopy referral submitted

## 2022-02-19 NOTE — Assessment & Plan Note (Signed)
Referral for colonoscopy submitted

## 2022-02-19 NOTE — Assessment & Plan Note (Signed)
Reported hx of multiple thyroid nodules  Will order thyroid US and collect Tsh and free T4 Follow up in four weeks

## 2022-02-19 NOTE — Assessment & Plan Note (Signed)
Welcome patient to practice Reviewed patient's medical history Reviewed patient's medications Reviewed patient surgical and social history Discussed roles and expectations primary care physician-patient relationship

## 2022-02-19 NOTE — Patient Instructions (Addendum)
Please schedule an appointment for your physical in the next 4-6 weeks. We will obtain screening blood work at that time including cholesterol.   Today we will check thyroid levels and I have ordered ultrasound of your thyroid to follow up on the nodules   Please be on the lookout for calls from Gynecology and Gastroenterology for your colonoscopy

## 2022-02-20 LAB — TSH+FREE T4
Free T4: 1.03 ng/dL (ref 0.82–1.77)
TSH: 2.84 u[IU]/mL (ref 0.450–4.500)

## 2022-02-23 ENCOUNTER — Encounter: Payer: Self-pay | Admitting: Family Medicine

## 2022-02-25 ENCOUNTER — Encounter: Payer: Self-pay | Admitting: Family Medicine

## 2022-03-04 ENCOUNTER — Telehealth: Payer: Self-pay

## 2022-03-04 ENCOUNTER — Ambulatory Visit
Admission: RE | Admit: 2022-03-04 | Discharge: 2022-03-04 | Disposition: A | Discharge status: Home / Self Care | Payer: Medicaid Other | Source: Ambulatory Visit | Attending: Family Medicine | Admitting: Family Medicine

## 2022-03-04 ENCOUNTER — Other Ambulatory Visit: Payer: Self-pay

## 2022-03-04 DIAGNOSIS — E042 Nontoxic multinodular goiter: Secondary | ICD-10-CM | POA: Insufficient documentation

## 2022-03-04 DIAGNOSIS — Z800 Family history of malignant neoplasm of digestive organs: Secondary | ICD-10-CM

## 2022-03-04 DIAGNOSIS — Z86010 Personal history of colonic polyps: Secondary | ICD-10-CM

## 2022-03-04 MED ORDER — SUFLAVE 178.7 G PO SOLR: 1.0000 | Freq: Every day | ORAL | 0 refills | Status: AC

## 2022-03-04 NOTE — Telephone Encounter (Signed)
Gastroenterology Pre-Procedure Review  Request Date: 05/14/22 Requesting Physician: Dr. Allen Norris  PATIENT REVIEW QUESTIONS: The patient responded to the following health history questions as indicated:    1. Are you having any GI issues? no 2. Do you have a personal history of Polyps? Yes history of colon polyps last colonoscopy performed by Dr. Allen Norris 04/2016 3. Do you have a family history of Colon Cancer or Polyps? yes (father colon cancer) 4. Diabetes Mellitus? no 5. Joint replacements in the past 12 months?no 6. Major health problems in the past 3 months?no 7. Any artificial heart valves, MVP, or defibrillator?no    MEDICATIONS & ALLERGIES:    Patient reports the following regarding taking any anticoagulation/antiplatelet therapy:   Plavix, Coumadin, Eliquis, Xarelto, Lovenox, Pradaxa, Brilinta, or Effient? no Aspirin? no  Patient confirms/reports the following medications:  Current Outpatient Medications  Medication Sig Dispense Refill   Cholecalciferol (VITAMIN D3) 2000 units capsule Take 2,000 Units by mouth daily.     ferrous sulfate 325 (65 FE) MG tablet Take 1 tablet (325 mg total) by mouth 2 (two) times daily with a meal. 180 tablet 3   Melatonin 10 MG TABS Take by mouth.     selenium 50 MCG TABS tablet Take 50 mcg by mouth daily.     No current facility-administered medications for this visit.    Patient confirms/reports the following allergies:  No Known Allergies  No orders of the defined types were placed in this encounter.   AUTHORIZATION INFORMATION Primary Insurance: 1D#: Group #:  Secondary Insurance: 1D#: Group #:  SCHEDULE INFORMATION: Date: 05/14/22 Time: Location: [facility]

## 2022-03-14 DIAGNOSIS — Z419 Encounter for procedure for purposes other than remedying health state, unspecified: Secondary | ICD-10-CM | POA: Diagnosis not present

## 2022-03-15 NOTE — Progress Notes (Unsigned)
I,Jasmin Christian,acting as a scribe for [employer], MD.,have documented all relevant documentation on the behalf of Jasmin Foster, MD,as directed by  Jasmin Foster, MD while in the presence of Jasmin Foster, MD.   Complete physical exam   Patient: Jasmin Christian   DOB: 15-Jul-1970   52 y.o. Female  MRN: [id number] Visit Date: 03/18/2022  Today's healthcare provider: [name], MD   Chief Complaint  Patient presents with   Annual Exam   Subjective    Jasmin Christian is a 52 y.o. female who presents today for a complete physical exam.   She reports consuming a  well balanced  diet. Home exercise routine includes yoga. She generally feels well. She reports sleeping poorly. She does  have additional problems to discuss today.    Changes in hearing Patient reports that 14 years or more, she is at multiple family members report that she speaks aloud but she does not perceive her voices meanwhile.  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  Patient does not have history of exposure to prolonged loud noises.  No history of trauma to the ears.  She would like to have her hearing checked.  Skin lesions Patient is requesting referral to dermatology.  She reports that she has to moles on her right chest wall that have been present for several years.  She reports that they do not bleed or drain any fluid.  She states that sometimes she can scratch in the area and it will irritate one of the moles.  She states that 1 has increased in size over the years.  She denies personal or family history of skin cancer.   Multiple thyroid nodules Patient would like to establish care with endocrinology.  She has history of multiple thyroid nodules and enlarged thyroid gland.  She is being followed by radiology for surveillance scanning of these areas.  Health Maintenance  Patient is followed by [facility]  Screenings Recommended  Flu vaccine-declined Covid vaccine-Declined Shingles: Tdap: Today  Past Medical History:  Diagnosis Date   Anemia    Migraine    Thyroid disease    Past Surgical History:  Procedure Laterality Date   COLONOSCOPY WITH PROPOFOL N/A 05/07/2016   Procedure: COLONOSCOPY WITH PROPOFOL;  Surgeon: Lucilla Lame, MD;  Location: [facility] ENDOSCOPY;  Service: Endoscopy;  Laterality: N/A;   Social History   Socioeconomic History   Marital status: Married    Spouse name: Not on file   Number of children: Not on file   Years of education: Not on file   Highest education level: Not on file  Occupational History   Not on file  Tobacco Use   Smoking status: Never   Smokeless tobacco: Never  Substance and Sexual Activity   Alcohol use: No   Drug use: No   Sexual activity: Not on file  Other Topics Concern   Not on file  Social History Narrative   Not on file   Social Determinants of Health   Financial Resource Strain: Not on file  Food Insecurity: Not on file  Transportation Needs: Not on file  Physical Activity: Not on file  Stress: Not on file  Social Connections: Not on file  Intimate Partner Violence: Not on file   Family Status  Relation Name Status   Mother  Alive   Father  Deceased at age 39   Family History  Problem Relation Age of Onset   Colon cancer Father  No Known Allergies  Patient Care Team: Jasmin Foster, MD as PCP - General (Family Medicine) Rico Junker, RN as Registered Nurse Theodore Demark, RN (Inactive) as Registered Nurse   Medications: Outpatient Medications Prior to Visit  Medication Sig   Cholecalciferol (VITAMIN D3) 2000 units capsule Take 2,000 Units by mouth daily.   ferrous sulfate 325 (65 FE) MG tablet Take 1 tablet (325 mg total) by mouth 2 (two) times daily with a meal.   Melatonin 10 MG TABS Take by mouth.   Zinc 50 MG TABS Take by mouth daily.   selenium 50 MCG TABS tablet Take 50 mcg by mouth  daily.   No facility-administered medications prior to visit.    Review of Systems  Skin:  Positive for rash.  All other systems reviewed and are negative.     Objective    BP 99/67 (BP Location: Right Arm, Patient Position: Sitting, Cuff Size: Normal)   Pulse 89   Temp 98 F (36.7 C) (Oral)   Resp 16   Ht 5' 6.5" (1.689 m)   Wt 121 lb 1.6 oz (54.9 kg)   LMP 05/05/2016   BMI 19.25 kg/m     Physical Exam Vitals reviewed.  Constitutional:      General: She is not in acute distress.    Appearance: Normal appearance. She is normal weight. She is not ill-appearing, toxic-appearing or diaphoretic.  HENT:     Head: Normocephalic and atraumatic.     Right Ear: External ear normal. No decreased hearing noted. No tenderness. A middle ear effusion is present. There is no impacted cerumen. Tympanic membrane is not erythematous or bulging.     Left Ear: External ear normal. No decreased hearing noted. No tenderness. A middle ear effusion is present. There is no impacted cerumen. Tympanic membrane is not erythematous or bulging.     Nose: Nose normal.     Mouth/Throat:     Pharynx: Oropharynx is clear.  Eyes:     General: No scleral icterus.    Extraocular Movements: Extraocular movements intact.     Conjunctiva/sclera: Conjunctivae normal.     Pupils: Pupils are equal, round, and reactive to light.  Neck:     Thyroid: Thyromegaly present. No thyroid tenderness.     Comments: Known thyroid nodules  Cardiovascular:     Rate and Rhythm: Normal rate and regular rhythm.     Pulses: Normal pulses.     Heart sounds: Normal heart sounds. No murmur heard.    No friction rub. No gallop.  Pulmonary:     Effort: Pulmonary effort is normal. No respiratory distress.     Breath sounds: Normal breath sounds. No wheezing, rhonchi or rales.  Abdominal:     General: Bowel sounds are normal. There is no distension.     Palpations: Abdomen is soft. There is no mass.     Tenderness: There is  no abdominal tenderness. There is no guarding.  Musculoskeletal:        General: No deformity.     Cervical back: Normal range of motion and neck supple. No edema, erythema or rigidity. No pain with movement. Normal range of motion.     Right lower leg: No edema.     Left lower leg: No edema.  Lymphadenopathy:     Cervical: No cervical adenopathy.  Skin:    General: Skin is warm.     Capillary Refill: Capillary refill takes less than 2 seconds.     Findings: Lesion  present. No erythema or rash.  Neurological:     General: No focal deficit present.     Mental Status: She is alert and oriented to person, place, and time.     Cranial Nerves: No cranial nerve deficit.     Motor: No weakness.     Coordination: Coordination normal.     Gait: Gait normal.  Psychiatric:        Mood and Affect: Mood normal.        Behavior: Behavior normal.        Last depression screening scores    03/18/2022   10:35 AM 02/19/2022   10:35 AM  PHQ 2/9 Scores  PHQ - 2 Score 0 0  PHQ- 9 Score 0 0   Last fall risk screening    03/18/2022   10:35 AM  [place] in the past year? 0  Number falls in past yr: 0  Injury with Fall? 0  Risk for fall due to : No Fall Risks   Last Audit-C alcohol use screening    03/18/2022   10:35 AM  Alcohol Use Disorder Test (AUDIT)  1. How often do you have a drink containing alcohol? 0  2. How many drinks containing alcohol do you have on a typical day when you are drinking? 0  3. How often do you have six or more drinks on one occasion? 0  AUDIT-C Score 0   A score of 3 or more in women, and 4 or more in men indicates increased risk for alcohol abuse, EXCEPT if all of the points are from question 1   No results found for any visits on 03/18/22.  Assessment & Plan    Routine Health Maintenance and Physical Exam  Exercise Activities and Dietary recommendations  Goals   None     There is no immunization history for the selected administration types  on file for this patient.  Health Maintenance  Topic Date Due   COVID-19 Vaccine (1) Never done   DTaP/Tdap/Td (1 - Tdap) Never done   Zoster Vaccines- Shingrix (1 of 2) Never done   MAMMOGRAM  12/07/2020   COLONOSCOPY (Pts 45-67yr Insurance coverage will need to be confirmed)  05/07/2021   PAP SMEAR-Modifier  11/02/2021   INFLUENZA VACCINE  05/12/2022 (Originally 09/11/2021)   Hepatitis C Screening  03/19/2023 (Originally 06/09/1986)   HIV Screening  03/19/2023 (Originally 06/09/1983)   HPV VACCINES  Aged Out    Discussed health benefits of physical activity, and encouraged her to engage in regular exercise appropriate for her age and condition.  Problem List Items Addressed This Visit       Endocrine   Multiple thyroid nodules    Patient with moderately and mildly suspicious thyroid nodules on ultrasound Referral submitted to endocrinology for further evaluation Patient to complete regular surveillance for thyroid nodules Exam consistent with enlargement on the right side of the thyroid gland today TSH evaluated at prior visit Lab Results  Component Value Date   TSH 2.840 02/19/2022        Relevant Orders   Ambulatory referral to Endocrinology     Musculoskeletal and Integument   Skin lesion of chest wall    Chronic problem Stable Referral submitted to dermatology for further evaluation of skin lesions       Relevant Orders   Ambulatory referral to Dermatology     Other   Family hx of colon cancer    Patient scheduled for colonoscopy on 05/14/2022  Encounter for annual physical examination excluding gynecological examination in a patient older than 17 years - Primary    Discussed age-appropriate screenings including Pap smear and mammogram, patient plans to have these completed with her gynecologist Recommended shingles vaccine, patient given more information via AVS to consider Patient received tetanus vaccine Screening for cholesterol abnormalities,  screening for electrolyte, kidney and hepatic abnormalities with CMP and lipid panel collected today       Relevant Orders   Comprehensive metabolic panel   Lipid panel   Hemoglobin A1c   Decreased hearing of both ears    Chronic problem Stable Submitted referral to ear nose and throat for audiology evaluation Normal cranial nerve testing on exam today        Relevant Orders   Ambulatory referral to ENT     Return in about 1 year (around 03/19/2023) for annual physical.      The entirety of the information documented in the History of Present Illness, Review of Systems and Physical Exam were personally obtained by me. Portions of this information were initially documented by Lyndel Pleasure, CMA and reviewed by me for thoroughness and accuracy.Jasmin Foster, MD     Jasmin Foster, MD  [facility] [phone] (phone) [phone] (fax)  [place]

## 2022-03-18 ENCOUNTER — Ambulatory Visit (INDEPENDENT_AMBULATORY_CARE_PROVIDER_SITE_OTHER): Payer: Medicaid Other | Admitting: Family Medicine

## 2022-03-18 ENCOUNTER — Encounter: Payer: Self-pay | Admitting: Family Medicine

## 2022-03-18 VITALS — BP 99/67 | HR 89 | Temp 98.0°F | Resp 16 | Ht 66.5 in | Wt 121.1 lb

## 2022-03-18 DIAGNOSIS — Z23 Encounter for immunization: Secondary | ICD-10-CM | POA: Insufficient documentation

## 2022-03-18 DIAGNOSIS — L989 Disorder of the skin and subcutaneous tissue, unspecified: Secondary | ICD-10-CM | POA: Insufficient documentation

## 2022-03-18 DIAGNOSIS — Z0000 Encounter for general adult medical examination without abnormal findings: Secondary | ICD-10-CM | POA: Insufficient documentation

## 2022-03-18 DIAGNOSIS — E042 Nontoxic multinodular goiter: Secondary | ICD-10-CM | POA: Diagnosis not present

## 2022-03-18 DIAGNOSIS — Z800 Family history of malignant neoplasm of digestive organs: Secondary | ICD-10-CM | POA: Diagnosis not present

## 2022-03-18 DIAGNOSIS — H9193 Unspecified hearing loss, bilateral: Secondary | ICD-10-CM

## 2022-03-18 NOTE — Assessment & Plan Note (Signed)
Discussed age-appropriate screenings including Pap smear and mammogram, patient plans to have these completed with her gynecologist Recommended shingles vaccine, patient given more information via AVS to consider Patient received tetanus vaccine Screening for cholesterol abnormalities, screening for electrolyte, kidney and hepatic abnormalities with CMP and lipid panel collected today

## 2022-03-18 NOTE — Assessment & Plan Note (Addendum)
Chronic problem Stable Submitted referral to ear nose and throat for audiology evaluation Normal cranial nerve testing on exam today

## 2022-03-18 NOTE — Assessment & Plan Note (Signed)
Patient with moderately and mildly suspicious thyroid nodules on ultrasound Referral submitted to endocrinology for further evaluation Patient to complete regular surveillance for thyroid nodules Exam consistent with enlargement on the right side of the thyroid gland today TSH evaluated at prior visit Lab Results  Component Value Date   TSH 2.840 02/19/2022

## 2022-03-18 NOTE — Patient Instructions (Addendum)
Please be on the lookout for calls from the scheduling coordinators for both dermatology and endocrinology.   Please notify our office if you do not hear anything in 2 weeks.   Health Maintenance, Female Adopting a healthy lifestyle and getting preventive care are important in promoting health and wellness. Ask your health care provider about: The right schedule for you to have regular tests and exams. Things you can do on your own to prevent diseases and keep yourself healthy. What should I know about diet, weight, and exercise?  Eat a healthy diet  Eat a diet that includes plenty of vegetables, fruits, low-fat dairy products, and lean protein. Do not eat a lot of foods that are high in solid fats, added sugars, or sodium. Maintain a healthy weight Body mass index (BMI) is used to identify weight problems. It estimates body fat based on height and weight. Your health care provider can help determine your BMI and help you achieve or maintain a healthy weight. Get regular exercise Get regular exercise. This is one of the most important things you can do for your health. Most adults should: Exercise for at least 150 minutes each week. The exercise should increase your heart rate and make you sweat (moderate-intensity exercise). Do strengthening exercises at least twice a week. This is in addition to the moderate-intensity exercise. Spend less time sitting. Even light physical activity can be beneficial.  Watch cholesterol and blood lipids Have your blood tested for lipids and cholesterol at 54 years of age, then have this test every 5 years. Have your cholesterol levels checked more often if: Your lipid or cholesterol levels are high. You are older than 54 years of age. You are at high risk for heart disease. What should I know about cancer screening? Depending on your health history and family history, you may need to have cancer screening at various ages. This may include screening  for: Breast cancer. Cervical cancer. Colorectal cancer. Skin cancer. Lung cancer. What should I know about heart disease, diabetes, and high blood pressure? Blood pressure and heart disease High blood pressure causes heart disease and increases the risk of stroke. This is more likely to develop in people who have high blood pressure readings or are overweight. Have your blood pressure checked: Every 3-5 years if you are 43-45 years of age. Every year if you are 92 years old or older. Diabetes Have regular diabetes screenings. This checks your fasting blood sugar level. Have the screening done: Once every three years after age 51 if you are at a normal weight and have a low risk for diabetes. More often and at a younger age if you are overweight or have a high risk for diabetes. What should I know about preventing infection? Hepatitis B If you have a higher risk for hepatitis B, you should be screened for this virus. Talk with your health care provider to find out if you are at risk for hepatitis B infection. Hepatitis C Testing is recommended for: Everyone born from [birth year] through 1967. Anyone with known risk factors for hepatitis C. Sexually transmitted infections (STIs) Get screened for STIs, including gonorrhea and chlamydia, if: You are sexually active and are younger than 54 years of age. You are older than 54 years of age and your health care provider tells you that you are at risk for this type of infection. Your sexual activity has changed since you were last screened, and you are at increased risk for chlamydia or gonorrhea. Ask your  health care provider if you are at risk. Ask your health care provider about whether you are at high risk for HIV. Your health care provider may recommend a prescription medicine to help prevent HIV infection. If you choose to take medicine to prevent HIV, you should first get tested for HIV. You should then be tested every 3 months for as long as you  are taking the medicine. Pregnancy If you are about to stop having your period (premenopausal) and you may become pregnant, seek counseling before you get pregnant. Take 400 to 800 micrograms (mcg) of folic acid every day if you become pregnant. Ask for birth control (contraception) if you want to prevent pregnancy. Osteoporosis and menopause Osteoporosis is a disease in which the bones lose minerals and strength with aging. This can result in bone fractures. If you are 67 years old or older, or if you are at risk for osteoporosis and fractures, ask your health care provider if you should: Be screened for bone loss. Take a calcium or vitamin D supplement to lower your risk of fractures. Be given hormone replacement therapy (HRT) to treat symptoms of menopause. Follow these instructions at home: Alcohol use Do not drink alcohol if: Your health care provider tells you not to drink. You are pregnant, may be pregnant, or are planning to become pregnant. If you drink alcohol: Limit how much you have to: 0-1 drink a day. Know how much alcohol is in your drink. In the [place] one drink equals one 12 oz bottle of beer (355 mL), one 5 oz glass of wine (148 mL), or one 1 oz glass of hard liquor (44 mL). Lifestyle Do not use any products that contain nicotine or tobacco. These products include cigarettes, chewing tobacco, and vaping devices, such as e-cigarettes. If you need help quitting, ask your health care provider. Do not use street drugs. Do not share needles. Ask your health care provider for help if you need support or information about quitting drugs. General instructions Schedule regular health, dental, and eye exams. Stay current with your vaccines. Tell your health care provider if: You often feel depressed. You have ever been abused or do not feel safe at home. Summary Adopting a healthy lifestyle and getting preventive care are important in promoting health and wellness. Follow your  health care provider's instructions about healthy diet, exercising, and getting tested or screened for diseases. Follow your health care provider's instructions on monitoring your cholesterol and blood pressure. This information is not intended to replace advice given to you by your health care provider. Make sure you discuss any questions you have with your health care provider. Document Revised: 06/19/2020 Document Reviewed: 06/19/2020 Elsevier Patient Education  [place].    Zoster Vaccine Injection What is this medication? ZOSTER VACCINE (ZOS ter vak SEEN) reduces the risk of herpes zoster (shingles). It does not treat shingles. It is still possible to get shingles after receiving the vaccine, but the symptoms may be less severe or not last as long. It works by helping your immune system learn how to fight off a future infection. This medicine may be used for other purposes; ask your health care provider or pharmacist if you have questions. COMMON BRAND NAME(S): [facility] What should I tell my care team before I take this medication? They need to know if you have any of these conditions: Cancer Immune system problems An unusual or allergic reaction to Zoster vaccine, other medications, foods, dyes, or preservatives Pregnant or trying to  get pregnant Breastfeeding How should I use this medication? This vaccine is injected into a muscle. It is given by your care team. This vaccine requires 2 doses to get the full benefit. Set a reminder for when your next dose is due. A copy of Vaccine Information Statements will be given before each vaccination. Be sure to read this information carefully each time. This sheet may change often. Talk to your care team about the use of this vaccine in children. This vaccine is not approved for use in children. Overdosage: If you think you have taken too much of this medicine contact a poison control center or emergency room at once. NOTE: This  medicine is only for you. Do not share this medicine with others. What if I miss a dose? Keep appointments for follow-up (booster) doses. It is important not to miss your dose. Call your care team if you are unable to keep an appointment. What may interact with this medication? Medications that suppress your immune system Medications to treat cancer Steroid medications, such as prednisone or cortisone This list may not describe all possible interactions. Give your health care provider a list of all the medicines, herbs, non-prescription drugs, or dietary supplements you use. Also tell them if you smoke, drink alcohol, or use illegal drugs. Some items may interact with your medicine. What should I watch for while using this medication? Visit your care team regularly. This vaccine, like all vaccines, may not fully protect everyone. What side effects may I notice from receiving this medication? Side effects that you should report to your care team as soon as possible: Allergic reactions--skin rash, itching, hives, swelling of the face, lips, tongue, or throat Side effects that usually do not require medical attention (report these to your care team if they continue or are bothersome): Chills Fatigue Feeling faint or lightheaded Fever Headache Muscle pain Pain, redness, or irritation at injection site This list may not describe all possible side effects. Call your doctor for medical advice about side effects. You may report side effects to FDA at 1-800-FDA-1088. Where should I keep my medication? This vaccine is only given by your care team. It will not be stored at home. NOTE: This sheet is a summary. It may not cover all possible information. If you have questions about this medicine, talk to your doctor, pharmacist, or health care provider.  2023 Elsevier/Gold Standard (2021-07-11 00:00:00)

## 2022-03-18 NOTE — Assessment & Plan Note (Signed)
Chronic problem Stable Referral submitted to dermatology for further evaluation of skin lesions

## 2022-03-18 NOTE — Assessment & Plan Note (Signed)
Tetanus vaccine administered today Patient tolerated well

## 2022-03-18 NOTE — Assessment & Plan Note (Signed)
Patient scheduled for colonoscopy on 05/14/2022

## 2022-03-19 LAB — COMPREHENSIVE METABOLIC PANEL
ALT: 20 IU/L (ref 0–32)
AST: 17 IU/L (ref 0–40)
Albumin/Globulin Ratio: 1.7 (ref 1.2–2.2)
Albumin: 4.4 g/dL (ref 3.8–4.9)
Alkaline Phosphatase: 89 IU/L (ref 44–121)
BUN/Creatinine Ratio: 13 (ref 9–23)
BUN: 9 mg/dL (ref 6–24)
Bilirubin Total: 0.3 mg/dL (ref 0.0–1.2)
CO2: 24 mmol/L (ref 20–29)
Calcium: 9.1 mg/dL (ref 8.7–10.2)
Chloride: 104 mmol/L (ref 96–106)
Creatinine, Ser: 0.71 mg/dL (ref 0.57–1.00)
Globulin, Total: 2.6 g/dL (ref 1.5–4.5)
Glucose: 95 mg/dL (ref 70–99)
Potassium: 4.6 mmol/L (ref 3.5–5.2)
Sodium: 143 mmol/L (ref 134–144)
Total Protein: 7 g/dL (ref 6.0–8.5)
eGFR: 102 mL/min/{1.73_m2} (ref >59)

## 2022-03-19 LAB — LIPID PANEL
Chol/HDL Ratio: 3.8 ratio (ref 0.0–4.4)
Cholesterol, Total: 192 mg/dL (ref 100–199)
HDL: 50 mg/dL (ref >39)
LDL Chol Calc (NIH): 123 mg/dL — ABNORMAL HIGH (ref 0–99)
Triglycerides: 108 mg/dL (ref 0–149)
VLDL Cholesterol Cal: 19 mg/dL (ref 5–40)

## 2022-03-19 LAB — HEMOGLOBIN A1C
Est. average glucose Bld gHb Est-mCnc: 117 mg/dL
Hgb A1c MFr Bld: 5.7 % — ABNORMAL HIGH (ref 4.8–5.6)

## 2022-03-20 ENCOUNTER — Encounter: Payer: Self-pay | Admitting: Family Medicine

## 2022-03-20 DIAGNOSIS — L989 Disorder of the skin and subcutaneous tissue, unspecified: Secondary | ICD-10-CM

## 2022-04-01 NOTE — Progress Notes (Unsigned)
PCP: [name], MD   No chief complaint on file.   HPI:      Ms. Jasmin Christian is a 52 y.o. No obstetric history on file. whose LMP was Patient's last menstrual period was 05/05/2016., presents today for her annual examination.  Her menses are {norm/abn:715}, lasting {number: 22536} days.  Dysmenorrhea {dysmen:716}. She {does:18564} have intermenstrual bleeding. She {does:18564} have vasomotor sx.   Sex activity: {sex active: 315163}. She {does:18564} have vaginal dryness.  Last Pap: 11/11/18  Results were: no abnormalities /no HPV DNA done Hx of STDs: {STD hx:14358}  Last mammogram: {date:304500300}  Results were: {norm/abn:13465} There is no FH of breast cancer. There is no FH of ovarian cancer. The patient {does:18564} do self-breast exams.  Colonoscopy: scheduled 4/24 with DR. Wohl; Repeat due after 10*** years.   Tobacco use: {tob:20664} Alcohol use: {Alcohol:11675} No drug use Exercise: {exercise:31265}  She {does:18564} get adequate calcium and Vitamin D in her diet.  Labs with PCP.   Patient Active Problem List   Diagnosis Date Noted   Encounter for annual physical examination excluding gynecological examination in a patient older than 17 years 03/18/2022   Need for Tdap vaccination 03/18/2022   Decreased hearing of both ears 03/18/2022   Skin lesion of chest wall 03/18/2022   Multiple thyroid nodules 02/19/2022   Menopausal symptoms 02/19/2022   Family hx of colon cancer 02/19/2022   Screening for colon cancer 02/19/2022   History of adenomatous polyp of colon 05/07/2016   Family history of malignant neoplasm of gastrointestinal tract    Family history of colon cancer 02/20/2016   Thyroid nodule 02/20/2016    Past Surgical History:  Procedure Laterality Date   COLONOSCOPY WITH PROPOFOL N/A 05/07/2016   Procedure: COLONOSCOPY WITH PROPOFOL;  Surgeon: Lucilla Lame, MD;  Location: [facility] ENDOSCOPY;  Service: Endoscopy;  Laterality: N/A;     Family History  Problem Relation Age of Onset   Colon cancer Father     Social History   Socioeconomic History   Marital status: Married    Spouse name: Not on file   Number of children: Not on file   Years of education: Not on file   Highest education level: Not on file  Occupational History   Not on file  Tobacco Use   Smoking status: Never   Smokeless tobacco: Never  Substance and Sexual Activity   Alcohol use: No   Drug use: No   Sexual activity: Not on file  Other Topics Concern   Not on file  Social History Narrative   Not on file   Social Determinants of Health   Financial Resource Strain: Not on file  Food Insecurity: Not on file  Transportation Needs: Not on file  Physical Activity: Not on file  Stress: Not on file  Social Connections: Not on file  Intimate Partner Violence: Not on file     Current Outpatient Medications:    Cholecalciferol (VITAMIN D3) 2000 units capsule, Take 2,000 Units by mouth daily., Disp: , Rfl:    ferrous sulfate 325 (65 FE) MG tablet, Take 1 tablet (325 mg total) by mouth 2 (two) times daily with a meal., Disp: 180 tablet, Rfl: 3   Melatonin 10 MG TABS, Take by mouth., Disp: , Rfl:    Zinc 50 MG TABS, Take by mouth daily., Disp: , Rfl:      ROS:  Review of Systems BREAST: No symptoms    Objective: LMP 05/05/2016    OBGyn Exam  Results:  No results found for this or any previous visit (from the past 24 hour(s)).  Assessment/Plan:  No diagnosis found.   No orders of the defined types were placed in this encounter.           GYN counsel {counseling: 16159}    F/U  No follow-ups on file.  Koby Hartfield B. Durwin Davisson, PA-C 04/01/2022 12:31 PM

## 2022-04-02 ENCOUNTER — Other Ambulatory Visit (HOSPITAL_COMMUNITY)
Admission: RE | Admit: 2022-04-02 | Discharge: 2022-04-02 | Disposition: A | Discharge status: Home / Self Care | Payer: Medicaid Other | Source: Ambulatory Visit | Attending: Obstetrics and Gynecology | Admitting: Obstetrics and Gynecology

## 2022-04-02 ENCOUNTER — Ambulatory Visit (INDEPENDENT_AMBULATORY_CARE_PROVIDER_SITE_OTHER): Payer: Medicaid Other | Admitting: Obstetrics and Gynecology

## 2022-04-02 ENCOUNTER — Encounter: Payer: Self-pay | Admitting: Obstetrics and Gynecology

## 2022-04-02 VITALS — BP 98/64 | Ht 65.5 in | Wt 122.0 lb

## 2022-04-02 DIAGNOSIS — N951 Menopausal and female climacteric states: Secondary | ICD-10-CM

## 2022-04-02 DIAGNOSIS — Z1231 Encounter for screening mammogram for malignant neoplasm of breast: Secondary | ICD-10-CM

## 2022-04-02 DIAGNOSIS — Z01419 Encounter for gynecological examination (general) (routine) without abnormal findings: Secondary | ICD-10-CM | POA: Diagnosis not present

## 2022-04-02 DIAGNOSIS — Z1151 Encounter for screening for human papillomavirus (HPV): Secondary | ICD-10-CM | POA: Diagnosis not present

## 2022-04-02 DIAGNOSIS — Z800 Family history of malignant neoplasm of digestive organs: Secondary | ICD-10-CM

## 2022-04-02 DIAGNOSIS — Z124 Encounter for screening for malignant neoplasm of cervix: Secondary | ICD-10-CM | POA: Diagnosis not present

## 2022-04-02 DIAGNOSIS — Z1211 Encounter for screening for malignant neoplasm of colon: Secondary | ICD-10-CM

## 2022-04-02 NOTE — Patient Instructions (Signed)
I value your feedback and you entrusting us with your care. If you get a [facility] patient survey, I would appreciate you taking the time to let us know about your experience today. Thank you!  [facility]: [phone]      

## 2022-04-03 ENCOUNTER — Encounter: Payer: Self-pay | Admitting: Obstetrics and Gynecology

## 2022-04-03 DIAGNOSIS — Z800 Family history of malignant neoplasm of digestive organs: Secondary | ICD-10-CM | POA: Insufficient documentation

## 2022-04-03 LAB — CYTOLOGY - PAP
Comment: NEGATIVE
Diagnosis: NEGATIVE
High risk HPV: NEGATIVE

## 2022-04-08 ENCOUNTER — Encounter: Payer: Self-pay | Admitting: Obstetrics and Gynecology

## 2022-04-08 ENCOUNTER — Telehealth: Payer: Self-pay

## 2022-04-08 ENCOUNTER — Other Ambulatory Visit: Payer: Self-pay | Admitting: Obstetrics and Gynecology

## 2022-04-08 MED ORDER — TRAZODONE HCL 50 MG PO TABS: 50.0000 mg | ORAL_TABLET | Freq: Every evening | ORAL | 1 refills | Status: DC | PRN

## 2022-04-08 NOTE — Telephone Encounter (Signed)
Copied from [place] [phone]. Topic: General - Call Back - No Documentation >> Apr 05, 2022  4:35 PM Ja-Kwan M wrote: Reason for CRM: Pt stated she was returning call to Rule. Cb# [phone]

## 2022-04-08 NOTE — Progress Notes (Signed)
Rx trazodone for insomnia

## 2022-04-12 DIAGNOSIS — Z419 Encounter for procedure for purposes other than remedying health state, unspecified: Secondary | ICD-10-CM | POA: Diagnosis not present

## 2022-04-16 ENCOUNTER — Ambulatory Visit
Admission: RE | Admit: 2022-04-16 | Discharge: 2022-04-16 | Disposition: A | Discharge status: Home / Self Care | Payer: Medicaid Other | Source: Ambulatory Visit | Attending: Obstetrics and Gynecology | Admitting: Obstetrics and Gynecology

## 2022-04-16 DIAGNOSIS — Z1231 Encounter for screening mammogram for malignant neoplasm of breast: Secondary | ICD-10-CM | POA: Insufficient documentation

## 2022-04-30 DIAGNOSIS — E063 Autoimmune thyroiditis: Secondary | ICD-10-CM | POA: Diagnosis not present

## 2022-04-30 DIAGNOSIS — D509 Iron deficiency anemia, unspecified: Secondary | ICD-10-CM | POA: Diagnosis not present

## 2022-04-30 DIAGNOSIS — G4700 Insomnia, unspecified: Secondary | ICD-10-CM | POA: Diagnosis not present

## 2022-04-30 DIAGNOSIS — E042 Nontoxic multinodular goiter: Secondary | ICD-10-CM | POA: Diagnosis not present

## 2022-05-13 ENCOUNTER — Ambulatory Visit: Payer: Self-pay | Admitting: Family Medicine

## 2022-05-13 ENCOUNTER — Encounter: Payer: Self-pay | Admitting: Gastroenterology

## 2022-05-13 DIAGNOSIS — Z419 Encounter for procedure for purposes other than remedying health state, unspecified: Secondary | ICD-10-CM | POA: Diagnosis not present

## 2022-05-14 ENCOUNTER — Ambulatory Visit
Admission: RE | Admit: 2022-05-14 | Discharge: 2022-05-14 | Disposition: A | Discharge status: Home / Self Care | Payer: Medicaid Other | Attending: Gastroenterology | Admitting: Gastroenterology

## 2022-05-14 ENCOUNTER — Encounter: Payer: Self-pay | Admitting: Gastroenterology

## 2022-05-14 ENCOUNTER — Ambulatory Visit: Payer: Medicaid Other | Admitting: Anesthesiology

## 2022-05-14 ENCOUNTER — Encounter
Admission: RE | Disposition: A | Discharge status: Home / Self Care | Payer: Self-pay | Source: Home / Self Care | Attending: Gastroenterology

## 2022-05-14 DIAGNOSIS — G43909 Migraine, unspecified, not intractable, without status migrainosus: Secondary | ICD-10-CM | POA: Insufficient documentation

## 2022-05-14 DIAGNOSIS — E079 Disorder of thyroid, unspecified: Secondary | ICD-10-CM | POA: Diagnosis not present

## 2022-05-14 DIAGNOSIS — R519 Headache, unspecified: Secondary | ICD-10-CM | POA: Insufficient documentation

## 2022-05-14 DIAGNOSIS — D649 Anemia, unspecified: Secondary | ICD-10-CM | POA: Insufficient documentation

## 2022-05-14 DIAGNOSIS — Z1211 Encounter for screening for malignant neoplasm of colon: Secondary | ICD-10-CM | POA: Diagnosis not present

## 2022-05-14 DIAGNOSIS — Z800 Family history of malignant neoplasm of digestive organs: Secondary | ICD-10-CM | POA: Insufficient documentation

## 2022-05-14 DIAGNOSIS — Z860100 Personal history of colon polyps, unspecified: Secondary | ICD-10-CM

## 2022-05-14 HISTORY — PX: COLONOSCOPY WITH PROPOFOL: SHX5780

## 2022-05-14 SURGERY — COLONOSCOPY WITH PROPOFOL
Anesthesia: General

## 2022-05-14 MED ORDER — PROPOFOL 500 MG/50ML IV EMUL
INTRAVENOUS | Status: DC | PRN
  Administered 2022-05-14: 50 ug/kg/min via INTRAVENOUS

## 2022-05-14 MED ORDER — LIDOCAINE HCL (CARDIAC) PF 100 MG/5ML IV SOSY
PREFILLED_SYRINGE | INTRAVENOUS | Status: DC | PRN
  Administered 2022-05-14: 50 mg via INTRAVENOUS

## 2022-05-14 MED ORDER — SODIUM CHLORIDE 0.9 % IV SOLN: INTRAVENOUS | Status: DC

## 2022-05-14 MED ORDER — LIDOCAINE HCL (PF) 2 % IJ SOLN
INTRAMUSCULAR | Status: AC
  Filled 2022-05-14: qty 5

## 2022-05-14 MED ORDER — PROPOFOL 10 MG/ML IV BOLUS
INTRAVENOUS | Status: DC | PRN
  Administered 2022-05-14: 50 mg via INTRAVENOUS

## 2022-05-14 MED ORDER — PROPOFOL 1000 MG/100ML IV EMUL
INTRAVENOUS | Status: AC
  Filled 2022-05-14: qty 100

## 2022-05-14 MED ORDER — LIDOCAINE HCL (PF) 2 % IJ SOLN
INTRAMUSCULAR | Status: AC
  Filled 2022-05-14: qty 10

## 2022-05-14 MED ORDER — DEXMEDETOMIDINE HCL IN NACL 200 MCG/50ML IV SOLN
INTRAVENOUS | Status: DC | PRN
  Administered 2022-05-14: 20 ug via INTRAVENOUS

## 2022-05-14 MED ORDER — DEXMEDETOMIDINE HCL IN NACL 80 MCG/20ML IV SOLN
INTRAVENOUS | Status: AC
  Filled 2022-05-14: qty 20

## 2022-05-14 NOTE — Op Note (Signed)
[facility] Patient Name: Jasmin Christian Procedure Date: 05/14/2022 8:35 AM MRN: [id number] Account #: [id number] Date of Birth: 20-Jan-1971 Admit Type: Outpatient Age: 52 Room: [facility] ENDO ROOM 4 Gender: Female Note Status: Finalized Instrument Name: Park Meo [id number] Procedure:             Colonoscopy Indications:           Screening for colorectal malignant neoplasm, Screening                         in patient at increased risk: Colorectal cancer in                         father before age 94 Providers:             Lucilla Lame MD, MD Medicines:             Propofol per Anesthesia Complications:         No immediate complications. Procedure:             Pre-Anesthesia Assessment:                        - Prior to the procedure, a History and Physical was                         performed, and patient medications and allergies were                         reviewed. The patient's tolerance of previous                         anesthesia was also reviewed. The risks and benefits                         of the procedure and the sedation options and risks                         were discussed with the patient. All questions were                         answered, and informed consent was obtained. Prior                         Anticoagulants: The patient has taken no anticoagulant                         or antiplatelet agents. ASA Grade Assessment: II - A                         patient with mild systemic disease. After reviewing                         the risks and benefits, the patient was deemed in                         satisfactory condition to undergo the procedure.                        After obtaining informed consent,  the colonoscope was                         passed under direct vision. Throughout the procedure,                         the patient's blood pressure, pulse, and oxygen                         saturations were monitored  continuously. The                         Colonoscope was introduced through the anus and                         advanced to the the cecum, identified by appendiceal                         orifice and ileocecal valve. The colonoscopy was                         performed without difficulty. The patient tolerated                         the procedure well. The quality of the bowel                         preparation was excellent. Findings:      The perianal and digital rectal examinations were normal.      The colon (entire examined portion) appeared normal. Impression:            - The entire examined colon is normal.                        - No specimens collected. Recommendation:        - Discharge patient to home.                        - Resume previous diet.                        - Continue present medications. Procedure Code(s):     --- Professional ---                        [phone], Colonoscopy, flexible; diagnostic, including                         collection of specimen(s) by brushing or washing, when                         performed (separate procedure) Diagnosis Code(s):     --- Professional ---                        Z12.11, Encounter for screening for malignant neoplasm                         of colon CPT copyright 2022 American Medical Association. All rights reserved. The codes documented in this report are preliminary and upon coder review may  be revised to meet current compliance requirements. Lucilla Lame MD, MD 05/14/2022 8:51:20 AM This report has been signed electronically. Number of Addenda: 0 Note Initiated On: 05/14/2022 8:35 AM Scope Withdrawal Time: 0 hours 6 minutes 54 seconds  Total Procedure Duration: 0 hours 11 minutes 33 seconds  Estimated Blood Loss:  Estimated blood loss: none.      [facility]

## 2022-05-14 NOTE — Transfer of Care (Signed)
Immediate Anesthesia Transfer of Care Note  Patient: CHASSY NORED  Procedure(s) Performed: COLONOSCOPY WITH PROPOFOL  Patient Location: PACU  Anesthesia Type:General  Level of Consciousness: sedated  Airway & Oxygen Therapy: Patient Spontanous Breathing  Post-op Assessment: Report given to RN and Post -op Vital signs reviewed and stable  Post vital signs: Reviewed and stable  Last Vitals:  Vitals Value Taken Time  BP 94/53 05/14/22 0853  Temp 35.9 C 05/14/22 0853  Pulse 71 05/14/22 0853  Resp 13 05/14/22 0853  SpO2 100 % 05/14/22 0853    Last Pain:  Vitals:   05/14/22 0853  TempSrc: Temporal  PainSc: 0-No pain         Complications: No notable events documented.

## 2022-05-14 NOTE — Anesthesia Postprocedure Evaluation (Signed)
Anesthesia Post Note  Patient: Jasmin Christian  Procedure(s) Performed: COLONOSCOPY WITH PROPOFOL  Patient location during evaluation: Endoscopy Anesthesia Type: General Level of consciousness: awake and alert Pain management: pain level controlled Vital Signs Assessment: post-procedure vital signs reviewed and stable Respiratory status: spontaneous breathing, nonlabored ventilation, respiratory function stable and patient connected to nasal cannula oxygen Cardiovascular status: blood pressure returned to baseline and stable Postop Assessment: no apparent nausea or vomiting Anesthetic complications: no   No notable events documented.   Last Vitals:  Vitals:   05/14/22 0903 05/14/22 0913  BP: 108/69 98/76  Pulse: 78 64  Resp: 20 19  Temp:    SpO2: 100% 100%    Last Pain:  Vitals:   05/14/22 0913  TempSrc:   PainSc: 0-No pain                 Ilene Qua

## 2022-05-14 NOTE — H&P (Signed)
Lucilla Lame, MD [address] [place], [place] [address] Phone: [phone] Fax : [phone]  Primary Care Physician:  [name], MD Primary Gastroenterologist:  Dr. Allen Norris  Pre-Procedure History & Physical: HPI:  Jasmin Christian is a 52 y.o. female is here for a screening colonoscopy.   Past Medical History:  Diagnosis Date   Anemia    Migraine    Thyroid disease     Past Surgical History:  Procedure Laterality Date   COLONOSCOPY WITH PROPOFOL N/A 05/07/2016   Procedure: COLONOSCOPY WITH PROPOFOL;  Surgeon: Lucilla Lame, MD;  Location: [facility] ENDOSCOPY;  Service: Endoscopy;  Laterality: N/A;    Prior to Admission medications   Medication Sig Start Date End Date Taking? Authorizing Provider  Cholecalciferol (VITAMIN D3) 10 MCG (400 UNIT) CAPS Take by mouth.   Yes [provider]  ferrous sulfate 325 (65 FE) MG tablet Take 1 tablet (325 mg total) by mouth 2 (two) times daily with a meal. 02/23/16  Yes Birdie Sons, MD  Zinc 50 MG TABS Take by mouth daily.   Yes [provider]  Melatonin 10 MG TABS Take by mouth.    [provider]  SUFLAVE 178.7 g SOLR SMARTSIG:1 Kit(s) By Mouth Once 03/05/22   [provider]  traZODone (DESYREL) 50 MG tablet Take 1 tablet (50 mg total) by mouth at bedtime as needed for sleep. [id number]   Copland, Elmo Putt B, PA-C    Allergies as of 03/04/2022   (No Known Allergies)    Family History  Problem Relation Age of Onset   Colon cancer Father 44   Breast cancer Neg Hx     Social History   Socioeconomic History   Marital status: Married    Spouse name: Not on file   Number of children: Not on file   Years of education: Not on file   Highest education level: Not on file  Occupational History   Not on file  Tobacco Use   Smoking status: Never   Smokeless tobacco: Never  Vaping Use   Vaping Use: Never used  Substance and Sexual Activity   Alcohol use: No   Drug use: No    Sexual activity: Not Currently    Birth control/protection: Post-menopausal  Other Topics Concern   Not on file  Social History Narrative   Not on file   Social Determinants of Health   Financial Resource Strain: Not on file  Food Insecurity: Not on file  Transportation Needs: Not on file  Physical Activity: Not on file  Stress: Not on file  Social Connections: Not on file  Intimate Partner Violence: Not on file    Review of Systems: See HPI, otherwise negative ROS  Physical Exam: BP 108/74   Pulse 73   Temp (!) 96.5 F (35.8 C) (Temporal)   Resp 18   Ht 5' 5.5" (1.664 m)   Wt 53.7 kg   LMP 05/05/2016   SpO2 100%   BMI 19.40 kg/m  General:   Alert,  pleasant and cooperative in NAD Head:  Normocephalic and atraumatic. Neck:  Supple; no masses or thyromegaly. Lungs:  Clear throughout to auscultation.    Heart:  Regular rate and rhythm. Abdomen:  Soft, nontender and nondistended. Normal bowel sounds, without guarding, and without rebound.   Neurologic:  Alert and  oriented x4;  grossly normal neurologically.  Impression/Plan: Jasmin Christian is now here to undergo a screening colonoscopy.  Risks, benefits, and alternatives regarding colonoscopy  have been reviewed with the patient.  Questions have been answered.  All parties agreeable.

## 2022-05-14 NOTE — Anesthesia Preprocedure Evaluation (Signed)
Anesthesia Evaluation  Patient identified by MRN, date of birth, ID band Patient awake    Reviewed: Allergy & Precautions, NPO status , Patient's Chart, lab work & pertinent test results  Airway Mallampati: II  TM Distance: >3 FB Neck ROM: full    Dental no notable dental hx.    Pulmonary neg pulmonary ROS   Pulmonary exam normal        Cardiovascular negative cardio ROS Normal cardiovascular exam     Neuro/Psych  Headaches  negative psych ROS   GI/Hepatic negative GI ROS, Neg liver ROS,,,  Endo/Other  negative endocrine ROS    Renal/GU negative Renal ROS  negative genitourinary   Musculoskeletal   Abdominal   Peds  Hematology  (+) Blood dyscrasia, anemia   Anesthesia Other Findings Past Medical History: No date: Anemia No date: Migraine No date: Thyroid disease  Past Surgical History: 05/07/2016: COLONOSCOPY WITH PROPOFOL; N/A     Comment:  Procedure: COLONOSCOPY WITH PROPOFOL;  Surgeon: Lucilla Lame, MD;  Location: [facility] ENDOSCOPY;  Service: Endoscopy;              Laterality: N/A;     Reproductive/Obstetrics negative OB ROS                             Anesthesia Physical Anesthesia Plan  ASA: 2  Anesthesia Plan: General   Post-op Pain Management: Minimal or no pain anticipated   Induction: Intravenous  PONV Risk Score and Plan: Propofol infusion and TIVA  Airway Management Planned: Natural Airway and Nasal Cannula  Additional Equipment:   Intra-op Plan:   Post-operative Plan:   Informed Consent: I have reviewed the patients History and Physical, chart, labs and discussed the procedure including the risks, benefits and alternatives for the proposed anesthesia with the patient or authorized representative who has indicated his/her understanding and acceptance.     Dental Advisory Given  Plan Discussed with: Anesthesiologist, CRNA and  Surgeon  Anesthesia Plan Comments: (Patient consented for risks of anesthesia including but not limited to:  - adverse reactions to medications - risk of airway placement if required - damage to eyes, teeth, lips or other oral mucosa - nerve damage due to positioning  - sore throat or hoarseness - Damage to heart, brain, nerves, lungs, other parts of body or loss of life  Patient voiced understanding.)       Anesthesia Quick Evaluation

## 2022-05-15 ENCOUNTER — Encounter: Payer: Self-pay | Admitting: Gastroenterology

## 2022-05-16 DIAGNOSIS — D509 Iron deficiency anemia, unspecified: Secondary | ICD-10-CM | POA: Diagnosis not present

## 2022-05-16 DIAGNOSIS — E042 Nontoxic multinodular goiter: Secondary | ICD-10-CM | POA: Diagnosis not present

## 2022-05-16 DIAGNOSIS — E063 Autoimmune thyroiditis: Secondary | ICD-10-CM | POA: Diagnosis not present

## 2022-05-16 DIAGNOSIS — G4700 Insomnia, unspecified: Secondary | ICD-10-CM | POA: Diagnosis not present

## 2022-05-20 DIAGNOSIS — H5213 Myopia, bilateral: Secondary | ICD-10-CM | POA: Diagnosis not present

## 2022-06-03 DIAGNOSIS — D2261 Melanocytic nevi of right upper limb, including shoulder: Secondary | ICD-10-CM | POA: Diagnosis not present

## 2022-06-03 DIAGNOSIS — L218 Other seborrheic dermatitis: Secondary | ICD-10-CM | POA: Diagnosis not present

## 2022-06-03 DIAGNOSIS — D2272 Melanocytic nevi of left lower limb, including hip: Secondary | ICD-10-CM | POA: Diagnosis not present

## 2022-06-03 DIAGNOSIS — D2262 Melanocytic nevi of left upper limb, including shoulder: Secondary | ICD-10-CM | POA: Diagnosis not present

## 2022-06-03 DIAGNOSIS — D224 Melanocytic nevi of scalp and neck: Secondary | ICD-10-CM | POA: Diagnosis not present

## 2022-06-03 DIAGNOSIS — D225 Melanocytic nevi of trunk: Secondary | ICD-10-CM | POA: Diagnosis not present

## 2022-06-03 DIAGNOSIS — D2271 Melanocytic nevi of right lower limb, including hip: Secondary | ICD-10-CM | POA: Diagnosis not present

## 2022-06-03 DIAGNOSIS — L814 Other melanin hyperpigmentation: Secondary | ICD-10-CM | POA: Diagnosis not present

## 2022-06-03 DIAGNOSIS — L720 Epidermal cyst: Secondary | ICD-10-CM | POA: Diagnosis not present

## 2022-06-12 DIAGNOSIS — Z419 Encounter for procedure for purposes other than remedying health state, unspecified: Secondary | ICD-10-CM | POA: Diagnosis not present

## 2022-07-13 DIAGNOSIS — Z419 Encounter for procedure for purposes other than remedying health state, unspecified: Secondary | ICD-10-CM | POA: Diagnosis not present

## 2022-08-05 ENCOUNTER — Ambulatory Visit: Payer: Medicaid Other | Admitting: Physician Assistant

## 2022-08-07 ENCOUNTER — Ambulatory Visit: Payer: Self-pay

## 2022-08-07 NOTE — Telephone Encounter (Signed)
    Chief Complaint: Lower back pain Symptoms: Above Frequency: 2 weeks ago, was moving things around at home Pertinent Negatives: Patient denies radiation of pain Disposition: [] ED /[] Urgent Care (no appt availability in office) / [x] Appointment(In office/virtual)/ []  [facility]/ [] Home Care/ [] Refused Recommended Disposition /[] [facility]/ []  Follow-up with PCP Additional Notes:   Reason for Disposition  [1] MODERATE back pain (e.g., interferes with normal activities) AND [2] present > 3 days  Answer Assessment - Initial Assessment Questions 1. ONSET: "When did the pain begin?"      2 weeks ago 2. LOCATION: "Where does it hurt?" (upper, mid or lower back)     Lower left 3. SEVERITY: "How bad is the pain?"  (e.g., Scale 1-10; mild, moderate, or severe)   - MILD (1-3): Doesn't interfere with normal activities.    - MODERATE (4-7): Interferes with normal activities or awakens from sleep.    - SEVERE (8-10): Excruciating pain, unable to do any normal activities.      Moderate-severe 4. PATTERN: "Is the pain constant?" (e.g., yes, no; constant, intermittent)      No pain when still 5. RADIATION: "Does the pain shoot into your legs or somewhere else?"     No 6. CAUSE:  "What do you think is causing the back pain?"      Unsure 7. BACK OVERUSE:  "Any recent lifting of heavy objects, strenuous work or exercise?"     Yes 8. MEDICINES: "What have you taken so far for the pain?" (e.g., nothing, acetaminophen, NSAIDS)     Motrin 9. NEUROLOGIC SYMPTOMS: "Do you have any weakness, numbness, or problems with bowel/bladder control?"     No 10. OTHER SYMPTOMS: "Do you have any other symptoms?" (e.g., fever, abdomen pain, burning with urination, blood in urine)       No 11. PREGNANCY: "Is there any chance you are pregnant?" "When was your last menstrual period?"       No  Protocols used: Back Pain-A-AH

## 2022-08-08 ENCOUNTER — Ambulatory Visit
Admission: EM | Admit: 2022-08-08 | Discharge: 2022-08-08 | Disposition: A | Discharge status: Home / Self Care | Payer: Medicaid Other | Attending: Urgent Care | Admitting: Urgent Care

## 2022-08-08 ENCOUNTER — Ambulatory Visit: Payer: Medicaid Other | Admitting: Physician Assistant

## 2022-08-08 DIAGNOSIS — M545 Low back pain, unspecified: Secondary | ICD-10-CM

## 2022-08-08 MED ORDER — PREDNISONE 20 MG PO TABS
ORAL_TABLET | ORAL | 0 refills | Status: AC
Start: 2022-08-08 — End: 2022-08-13

## 2022-08-08 MED ORDER — CYCLOBENZAPRINE HCL 5 MG PO TABS
5.0000 mg | ORAL_TABLET | Freq: Three times a day (TID) | ORAL | 0 refills | Status: DC | PRN
Start: 2022-08-08 — End: 2023-09-02

## 2022-08-08 NOTE — ED Triage Notes (Addendum)
Patient presents to [facility] for intermittent lower back pain x 2 weeks. Reports over these past two weeks it was getting better until she sneezed yesterday. Taking advil, last dose at 0700.

## 2022-08-08 NOTE — ED Provider Notes (Signed)
Renaldo Fiddler    CSN: [id number] Arrival date & time: 08/08/22  1914      History   Chief Complaint No chief complaint on file.   HPI Jasmin Christian is a 52 y.o. female.   HPI  Presents to UC with complaint of intermittent lower back pain x 2 weeks.  Patient endorses improved symptoms until yesterday when she experienced recurrence when she "sneezed".  She has been using Advil with last dose at 7 AM this morning.  She states the initial pain was on the left side of her lower back but is now both sides.  She states the pain occurred after preparing for a baby shower for her daughter and having to lift boxes.  She endorses previous history of lower back pain last year under similar circumstances.  Past Medical History:  Diagnosis Date   Anemia    Migraine    Thyroid disease     Patient Active Problem List   Diagnosis Date Noted   Personal history of colonic polyps 05/14/2022   Encounter for annual physical examination excluding gynecological examination in a patient older than 17 years 03/18/2022   Need for Tdap vaccination 03/18/2022   Decreased hearing of both ears 03/18/2022   Skin lesion of chest wall 03/18/2022   Multiple thyroid nodules 02/19/2022   Menopausal symptoms 02/19/2022   Screening for colon cancer 02/19/2022   History of adenomatous polyp of colon 05/07/2016   Family history of colon cancer 02/20/2016   Thyroid nodule 02/20/2016    Past Surgical History:  Procedure Laterality Date   COLONOSCOPY WITH PROPOFOL N/A 05/07/2016   Procedure: COLONOSCOPY WITH PROPOFOL;  Surgeon: Midge Minium, MD;  Location: [facility] ENDOSCOPY;  Service: Endoscopy;  Laterality: N/A;   COLONOSCOPY WITH PROPOFOL N/A 05/14/2022   Procedure: COLONOSCOPY WITH PROPOFOL;  Surgeon: Midge Minium, MD;  Location: [facility] ENDOSCOPY;  Service: Endoscopy;  Laterality: N/A;    OB History     Gravida  4   Para  4   Term  4   Preterm      AB      Living  4      SAB       IAB      Ectopic      Multiple      Live Births  4            Home Medications    Prior to Admission medications   Medication Sig Start Date End Date Taking? Authorizing Provider  Cholecalciferol (VITAMIN D3) 10 MCG (400 UNIT) CAPS Take by mouth.    [provider]  ferrous sulfate 325 (65 FE) MG tablet Take 1 tablet (325 mg total) by mouth 2 (two) times daily with a meal. 02/23/16   Malva Limes, MD  Melatonin 10 MG TABS Take by mouth.    [provider]  SUFLAVE 178.7 g SOLR SMARTSIG:1 Kit(s) By Mouth Once 03/05/22   [provider]  traZODone (DESYREL) 50 MG tablet Take 1 tablet (50 mg total) by mouth at bedtime as needed for sleep. 04/08/22   Copland, Ilona Sorrel, PA-C  Zinc 50 MG TABS Take by mouth daily.    [provider]    Family History Family History  Problem Relation Age of Onset   Colon cancer Father 23   Breast cancer Neg Hx     Social History Social History   Tobacco Use   Smoking status: Never   Smokeless tobacco: Never  Vaping  Use   Vaping Use: Never used  Substance Use Topics   Alcohol use: No   Drug use: No     Allergies   Patient has no known allergies.   Review of Systems Review of Systems   Physical Exam Triage Vital Signs ED Triage Vitals  Enc Vitals Group     BP      Pulse      Resp      Temp      Temp src      SpO2      Weight      Height      Head Circumference      Peak Flow      Pain Score      Pain Loc      Pain Edu?      Excl. in GC?    No data found.  Updated Vital Signs LMP 05/05/2016   Visual Acuity Right Eye Distance:   Left Eye Distance:   Bilateral Distance:    Right Eye Near:   Left Eye Near:    Bilateral Near:     Physical Exam Vitals reviewed.  Constitutional:      Appearance: Normal appearance.  Musculoskeletal:     Lumbar back: Spasms present. No tenderness or bony tenderness. Normal range of motion. Negative right straight leg raise test and negative  left straight leg raise test.  Skin:    General: Skin is warm and dry.  Neurological:     General: No focal deficit present.     Mental Status: She is alert and oriented to person, place, and time.  Psychiatric:        Mood and Affect: Mood normal.        Behavior: Behavior normal.      UC Treatments / Results  Labs (all labs ordered are listed, but only abnormal results are displayed) Labs Reviewed - No data to display  EKG   Radiology No results found.  Procedures Procedures (including critical care time)  Medications Ordered in UC Medications - No data to display  Initial Impression / Assessment and Plan / UC Course  I have reviewed the triage vital signs and the nursing notes.  Pertinent labs & imaging results that were available during my care of the patient were reviewed by me and considered in my medical decision making (see chart for details).   Jasmin Christian is a 52 y.o. female presenting with bilateral lower back pain. Patient is afebrile without recent antipyretics, satting well on room air. Overall is well appearing, well hydrated, without respiratory distress.  No bony spinal tenderness.  No muscle tenderness to palpation.  No limited range of motion.  Reviewed relevant chart history.   Treating patient for bilateral lower back pain, likely muscle spasm, with Flexeril 5 mg.  Recommended patient to take medication at nighttime to assist with sleep.  Will also provide anti-inflammatory treatment with prednisone taper.  Recommended supportive care with careful posture while lifting, core balance training.  Suggested evaluation by orthopedic provider as well as physical therapy.  Counseled patient on potential for adverse effects with medications prescribed/recommended today, ER and return-to-clinic precautions discussed, patient verbalized understanding and agreement with care plan.  Final Clinical Impressions(s) / UC Diagnoses   Final diagnoses:  None    Discharge Instructions   None    ED Prescriptions   None    PDMP not reviewed this encounter.   Charma Igo, [place] 08/08/22 [phone]

## 2022-08-08 NOTE — Discharge Instructions (Addendum)
Follow-up with an evaluation by an orthopedic or back specialist provider if your symptoms do not resolve with treatment, or if they recur.  I have provided contact information for [facility], a local orthopedic provider that does not require referrals.  Alternatively, you can request referral from your primary care provider.

## 2022-08-12 DIAGNOSIS — Z419 Encounter for procedure for purposes other than remedying health state, unspecified: Secondary | ICD-10-CM | POA: Diagnosis not present

## 2022-08-30 ENCOUNTER — Encounter: Payer: Self-pay | Admitting: Obstetrics and Gynecology

## 2022-09-01 ENCOUNTER — Other Ambulatory Visit: Payer: Self-pay | Admitting: Obstetrics and Gynecology

## 2022-09-01 MED ORDER — TRAZODONE HCL 50 MG PO TABS: 50.0000 mg | ORAL_TABLET | Freq: Every evening | ORAL | 3 refills | Status: DC | PRN

## 2022-09-01 NOTE — Progress Notes (Signed)
Rx RF trazodone. Works for insomnia.

## 2022-09-12 DIAGNOSIS — Z419 Encounter for procedure for purposes other than remedying health state, unspecified: Secondary | ICD-10-CM | POA: Diagnosis not present

## 2022-10-13 DIAGNOSIS — Z419 Encounter for procedure for purposes other than remedying health state, unspecified: Secondary | ICD-10-CM | POA: Diagnosis not present

## 2022-11-12 DIAGNOSIS — Z419 Encounter for procedure for purposes other than remedying health state, unspecified: Secondary | ICD-10-CM | POA: Diagnosis not present

## 2022-12-13 DIAGNOSIS — Z419 Encounter for procedure for purposes other than remedying health state, unspecified: Secondary | ICD-10-CM | POA: Diagnosis not present

## 2022-12-29 IMAGING — DX DG CHEST 1V PORT
1 series · 1 of 1 positions shown · non-contrast
Comparison: None.

CLINICAL DATA: Shortness of breath

EXAM:
PORTABLE CHEST 1 VIEW

[chest ap]
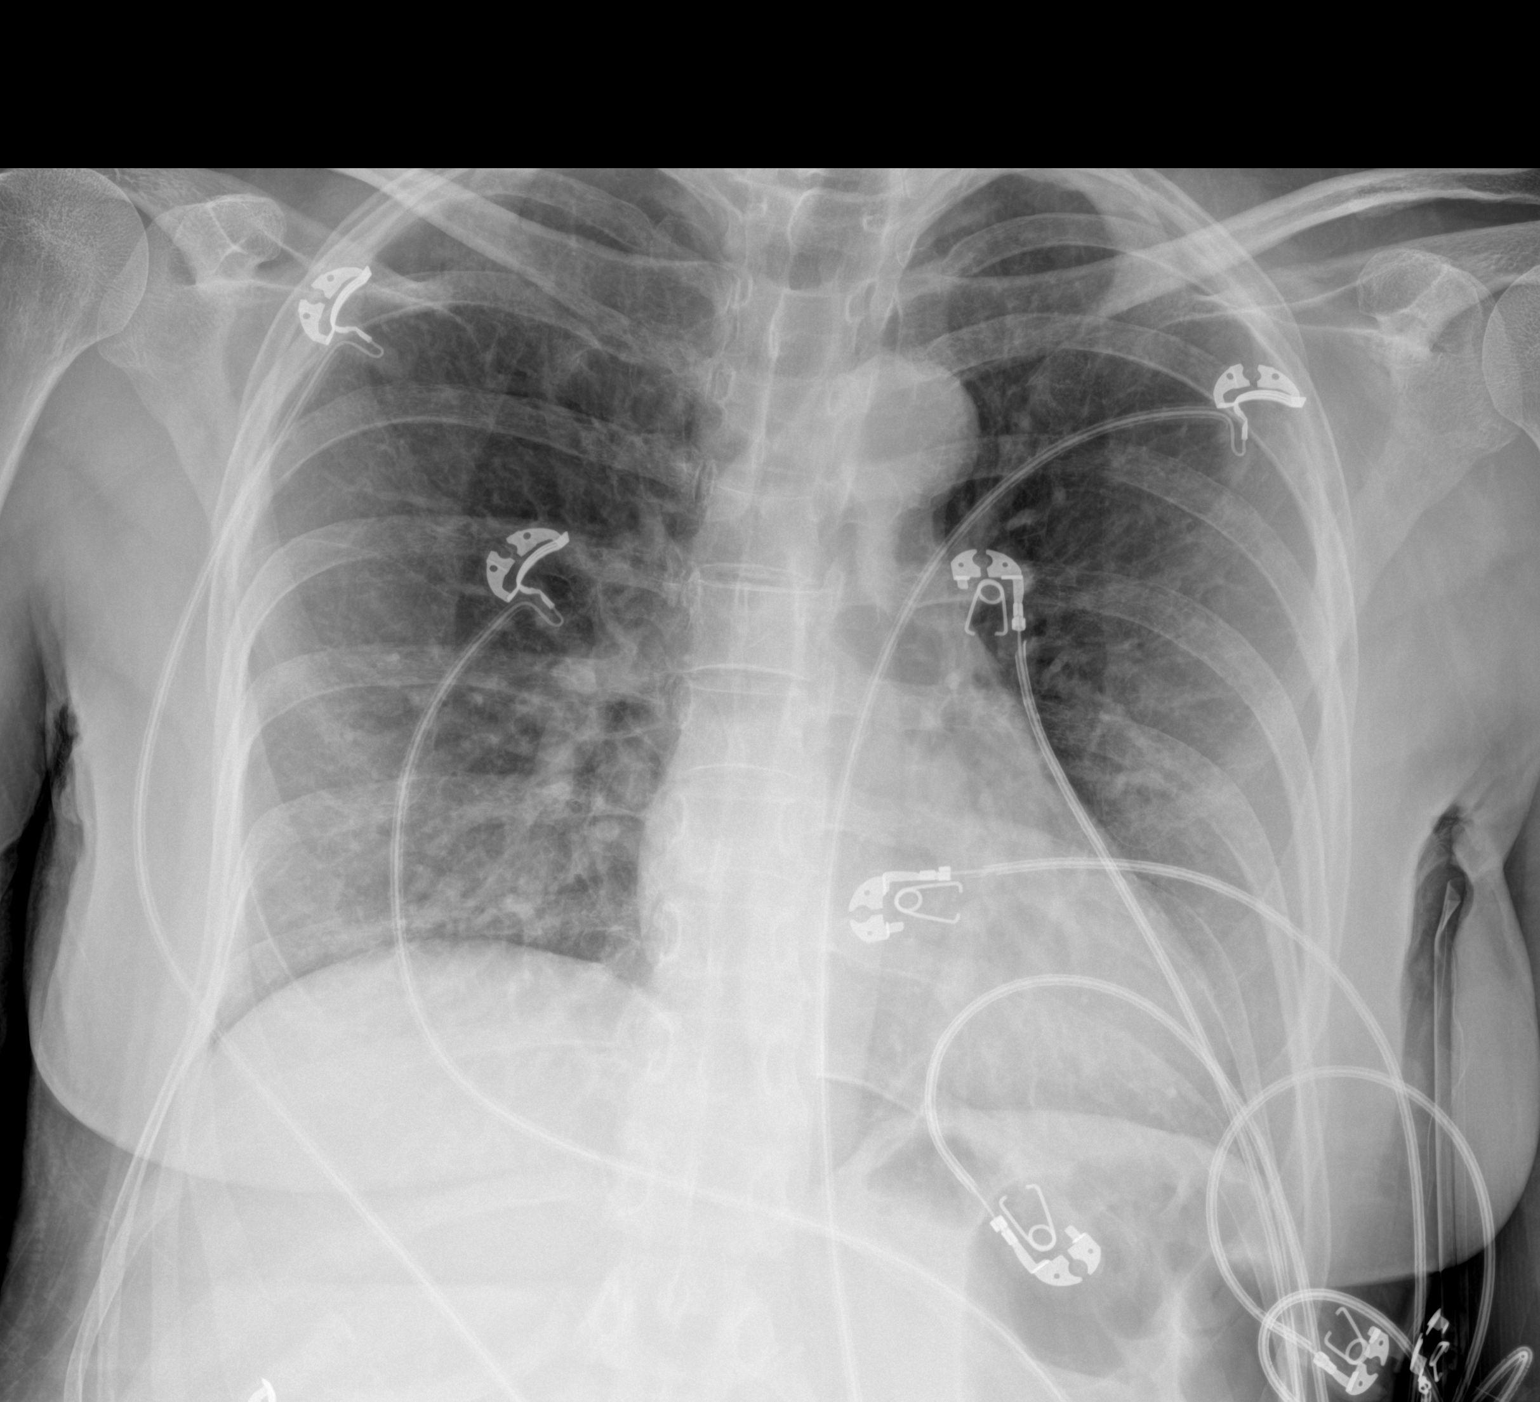

[1 of 1 positions shown; findings below may reference images not displayed]

FINDINGS: Normal heart size and mediastinal contours. No acute infiltrate or
edema. No effusion or pneumothorax. No acute osseous findings.

Artifact from EKG leads
IMPRESSION: Negative portable chest.

## 2023-01-12 DIAGNOSIS — Z419 Encounter for procedure for purposes other than remedying health state, unspecified: Secondary | ICD-10-CM | POA: Diagnosis not present

## 2023-02-12 DIAGNOSIS — Z419 Encounter for procedure for purposes other than remedying health state, unspecified: Secondary | ICD-10-CM | POA: Diagnosis not present

## 2023-03-15 DIAGNOSIS — Z419 Encounter for procedure for purposes other than remedying health state, unspecified: Secondary | ICD-10-CM | POA: Diagnosis not present

## 2023-03-17 ENCOUNTER — Telehealth: Payer: Self-pay

## 2023-03-17 DIAGNOSIS — E042 Nontoxic multinodular goiter: Secondary | ICD-10-CM

## 2023-03-17 NOTE — Telephone Encounter (Signed)
Copied from CRM [id number]. Topic: Referral - Request for Referral >> Mar 17, 2023 10:02 AM Jasmin Christian wrote: Reason for CRM: Pt needs a referral to be seen for her thyroid at [facility] clinic, she wants to be seen with Dr. Gustavus Messing. Rolih.

## 2023-03-17 NOTE — Telephone Encounter (Signed)
Pt needs a referral to be seen for her thyroid at [facility] clinic, she wants to be seen with Dr. Gustavus Messing. Rolih.   Referral placed

## 2023-03-19 ENCOUNTER — Ambulatory Visit: Payer: Medicaid Other | Admitting: Dermatology

## 2023-04-12 DIAGNOSIS — Z419 Encounter for procedure for purposes other than remedying health state, unspecified: Secondary | ICD-10-CM | POA: Diagnosis not present

## 2023-05-05 DIAGNOSIS — E063 Autoimmune thyroiditis: Secondary | ICD-10-CM | POA: Diagnosis not present

## 2023-05-05 DIAGNOSIS — E042 Nontoxic multinodular goiter: Secondary | ICD-10-CM | POA: Diagnosis not present

## 2023-05-24 DIAGNOSIS — Z419 Encounter for procedure for purposes other than remedying health state, unspecified: Secondary | ICD-10-CM | POA: Diagnosis not present

## 2023-06-23 DIAGNOSIS — Z419 Encounter for procedure for purposes other than remedying health state, unspecified: Secondary | ICD-10-CM | POA: Diagnosis not present

## 2023-07-24 DIAGNOSIS — Z419 Encounter for procedure for purposes other than remedying health state, unspecified: Secondary | ICD-10-CM | POA: Diagnosis not present

## 2023-08-14 ENCOUNTER — Encounter: Payer: Self-pay | Admitting: Family Medicine

## 2023-08-14 ENCOUNTER — Ambulatory Visit: Admitting: Family Medicine

## 2023-08-14 VITALS — BP 97/46 | HR 80 | Ht 65.0 in | Wt 119.0 lb

## 2023-08-14 DIAGNOSIS — M2011 Hallux valgus (acquired), right foot: Secondary | ICD-10-CM | POA: Insufficient documentation

## 2023-08-14 DIAGNOSIS — M79671 Pain in right foot: Secondary | ICD-10-CM

## 2023-08-14 MED ORDER — MELOXICAM 7.5 MG PO TABS
7.5000 mg | ORAL_TABLET | Freq: Every day | ORAL | 0 refills | Status: DC
Start: 2023-08-14 — End: 2023-11-20

## 2023-08-14 NOTE — Progress Notes (Signed)
 ACUTE VISIT   Patient: Jasmin Christian   DOB: [birth year]   53 y.o. Female  MRN: [id number]   PCP: Sharma Coyer, MD  Chief Complaint  Patient presents with   Bunions    She has a bunion on the R foot she can't wear closed toe shoes because its painful    Subjective    HPI HPI     Bunions    Additional comments: She has a bunion on the R foot she can't wear closed toe shoes because its painful       Last edited by Thelbert Eulalio HERO, CMA on 08/14/2023  2:26 PM.       Discussed the use of AI scribe software for clinical note transcription with the patient, who gave verbal consent to proceed.  History of Present Illness Jasmin Christian is a 53 year old female who presents with right foot pain due to a bunion.  She has been experiencing pain in her right foot for about a month, primarily due to a bunion. The pain intensifies when wearing closed-toe shoes, especially high heels, which she used to wear to church. The pain is localized to the top of her foot and is exacerbated by dorsiflexion of the toe, but it does not affect the bottom of her foot.  She has been unable to wear her usual shoes and has resorted to wearing flip-flops for comfort. She has not taken any medication like Advil for the pain. Her mother also has a bunion in the same location, but it has never caused her mother any pain or restricted her shoe choices.  She has tried using a sock with a wedge between the toes at night, which she ordered from [facility], but she has not noticed any significant difference. 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  She mentions experiencing fatigue, which she attributes to menopause. Her blood pressure tends to be on the lower side, with a recent reading of 96/55 at a pharmacy, but she does not feel faint or unwell. She  has not eaten lunch on the day of the visit, which she acknowledges might affect her blood pressure readings.     Medications: Outpatient Medications Prior to Visit  Medication Sig   Cholecalciferol (VITAMIN D3) 10 MCG (400 UNIT) CAPS Take by mouth.   cyclobenzaprine  (FLEXERIL ) 5 MG tablet Take 1 tablet (5 mg total) by mouth 3 (three) times daily as needed for muscle spasms. Medication will cause sedation. Do not drive or operate equipment while taking. (Patient not taking: Reported on 08/14/2023)   ferrous sulfate  325 (65 FE) MG tablet Take 1 tablet (325 mg total) by mouth 2 (two) times daily with a meal.   Melatonin 10 MG TABS Take by mouth.   SUFLAVE  178.7 g SOLR SMARTSIG:1 Kit(s) By Mouth Once (Patient not taking: Reported on 08/14/2023)   traZODone  (DESYREL ) 50 MG tablet Take 1 tablet (50 mg total) by mouth at bedtime as needed for sleep.   Zinc 50 MG TABS Take by mouth daily.   No facility-administered medications prior to visit.        Objective    BP (!) 97/46   Pulse 80   Ht 5' 5 (1.651 m)   Wt 119 lb (54 kg)   LMP 05/05/2016   SpO2 100%   BMI  19.80 kg/m    Physical Exam Musculoskeletal:       Feet:  Feet:     Comments: Area of tenderness is not erythematous, not edematous,     Physical Exam MUSCULOSKELETAL: Pain on dorsiflexion of the right foot, affecting the dorsal aspect.   No results found for any visits on 08/14/23.  Assessment & Plan      Assessment & Plan Bunion with possible tendonitis Reports a painful bunion on the right foot, present for a long time but recently causing pain for about a month. Pain exacerbated by dorsiflexion of the toe and wearing closed-toe shoes, suggesting possible tendonitis or arthritis. Location of pain does not fit with Morton's neuroma. Has not taken any medication for the pain yet. Inflammation of the tendon or arthritis suspected due to wearing high heels, contributing to pressure on the forefoot. Discussed hereditary  nature of bunions, as her mother has a similar condition without pain. Surgery is an option for bunions, but conservative management with NSAIDs and referral to podiatry is the initial approach. - Prescribe meloxicam 7.5 mg once daily for two weeks to reduce inflammation. - Refer to podiatry for further evaluation and possible imaging. - Advise against taking additional NSAIDs like naproxen, Aleve, Advil, or ibuprofen while on meloxicam.  Hypotension Reports consistently low blood pressure readings, with a recent measurement of 96/55 mmHg. Feels tired, attributing it to menopausal symptoms. If asymptomatic, low blood pressure may be her baseline.  General Health Maintenance Has not had a physical examination in over a year, with the last one due in February. Recently turned 53 years old, indicating the need for routine health maintenance and screenings. - Schedule a physical examination.      Return for CPE.        Rockie Agent, MD  [facility] [phone] (phone) [phone] (fax)  [facility]

## 2023-08-18 ENCOUNTER — Other Ambulatory Visit: Payer: Self-pay | Admitting: Family Medicine

## 2023-08-18 DIAGNOSIS — Z1231 Encounter for screening mammogram for malignant neoplasm of breast: Secondary | ICD-10-CM

## 2023-08-23 DIAGNOSIS — Z419 Encounter for procedure for purposes other than remedying health state, unspecified: Secondary | ICD-10-CM | POA: Diagnosis not present

## 2023-08-26 ENCOUNTER — Ambulatory Visit
Admission: RE | Admit: 2023-08-26 | Discharge: 2023-08-26 | Disposition: A | Discharge status: Home / Self Care | Source: Ambulatory Visit | Attending: Family Medicine | Admitting: Family Medicine

## 2023-08-26 DIAGNOSIS — Z1231 Encounter for screening mammogram for malignant neoplasm of breast: Secondary | ICD-10-CM | POA: Insufficient documentation

## 2023-09-02 ENCOUNTER — Ambulatory Visit (INDEPENDENT_AMBULATORY_CARE_PROVIDER_SITE_OTHER)

## 2023-09-02 ENCOUNTER — Encounter: Payer: Self-pay | Admitting: Podiatry

## 2023-09-02 ENCOUNTER — Ambulatory Visit: Payer: Self-pay | Admitting: Family Medicine

## 2023-09-02 ENCOUNTER — Ambulatory Visit: Admitting: Podiatry

## 2023-09-02 VITALS — Ht 65.0 in | Wt 119.0 lb

## 2023-09-02 DIAGNOSIS — M7751 Other enthesopathy of right foot: Secondary | ICD-10-CM | POA: Diagnosis not present

## 2023-09-02 DIAGNOSIS — M2011 Hallux valgus (acquired), right foot: Secondary | ICD-10-CM

## 2023-09-02 NOTE — Progress Notes (Signed)
   Chief Complaint  Patient presents with   Foot Pain    Pt is here due to right foot pain she states the pain is the bunion area, does not hurt when she touches it but if she flexs her foot the joint in the big toe hurts her, was seen by PCP for this issue and prescribe Meloxicam  on 7/3 and states toe felt much better afterwards.    HPI: 53 y.o. female presenting today as a new patient for evaluation of right great toe pain.  About 6 weeks ago she had idiopathic onset of right great toe pain.  She went to her PCP who prescribed meloxicam .  She has noticed some improvement and over the last 2 weeks she has no pain or tenderness.  She does have a family history of bunions.  Past Medical History:  Diagnosis Date   Anemia    Migraine    Thyroid  disease     Past Surgical History:  Procedure Laterality Date   COLONOSCOPY WITH PROPOFOL  N/A 05/07/2016   Procedure: COLONOSCOPY WITH PROPOFOL ;  Surgeon: Rogelia Copping, MD;  Location: [facility] ENDOSCOPY;  Service: Endoscopy;  Laterality: N/A;   COLONOSCOPY WITH PROPOFOL  N/A 05/14/2022   Procedure: COLONOSCOPY WITH PROPOFOL ;  Surgeon: Copping Rogelia, MD;  Location: [facility] ENDOSCOPY;  Service: Endoscopy;  Laterality: N/A;    No Known Allergies   Physical Exam: General: The patient is alert and oriented x3 in no acute distress.  Dermatology: Skin is warm, dry and supple bilateral lower extremities.   Vascular: Palpable pedal pulses bilaterally. Capillary refill within normal limits.  No appreciable edema.  No erythema.  Neurological: Grossly intact via light touch  Musculoskeletal Exam: No pedal deformities noted  Radiographic Exam RT foot 09/02/2023:  Normal osseous mineralization. Joint spaces preserved. Prominent head of the first metatarsal noted especially on the medial oblique view  Assessment/Plan of Care: 1.  Hallux valgus right  -Patient evaluated.  X-rays reviewed -traditionally the patient has not had any pain or symptoms associated to the  bunion deformity.  Idiopathic onset 6 weeks ago and since that time it has resolved.  She may resume full activity no restrictions -Continue conservative management including good supportive shoes and sneakers -Return to clinic PRN       Thresa EMERSON Sar, DPM [facility]  Dr. Thresa EMERSON Sar, DPM    [address] [place], [place] [address]                Office [phone]  Fax [phone]

## 2023-09-15 ENCOUNTER — Encounter: Payer: Self-pay | Admitting: Obstetrics & Gynecology

## 2023-09-15 ENCOUNTER — Ambulatory Visit: Admitting: Obstetrics & Gynecology

## 2023-09-15 VITALS — BP 106/63 | HR 80 | Ht 65.5 in | Wt 117.0 lb

## 2023-09-15 DIAGNOSIS — Z01419 Encounter for gynecological examination (general) (routine) without abnormal findings: Secondary | ICD-10-CM

## 2023-09-15 DIAGNOSIS — Z23 Encounter for immunization: Secondary | ICD-10-CM

## 2023-09-15 DIAGNOSIS — R6882 Decreased libido: Secondary | ICD-10-CM

## 2023-09-15 DIAGNOSIS — N838 Other noninflammatory disorders of ovary, fallopian tube and broad ligament: Secondary | ICD-10-CM

## 2023-09-15 DIAGNOSIS — Z0000 Encounter for general adult medical examination without abnormal findings: Secondary | ICD-10-CM | POA: Diagnosis not present

## 2023-09-15 MED ORDER — TRAZODONE HCL 50 MG PO TABS: 50.0000 mg | ORAL_TABLET | Freq: Every evening | ORAL | 3 refills | Status: AC | PRN

## 2023-09-15 MED ORDER — TESTOSTERONE 1.62 % TD GEL: TRANSDERMAL | 5 refills | Status: AC

## 2023-09-15 NOTE — Progress Notes (Signed)
 ANNUAL PREVENTATIVE CARE GYNECOLOGY  ENCOUNTER NOTE  Subjective:       Jasmin Christian is a 53 y.o. married G4P4004  (34, 51, 22, and 35 yo kids, 7 1/2 grands) here for a routine annual gynecologic exam. She is new to this practice. The patient is not currently sexually active due to decreased libido and husband's health issues.  The patient has never been taking hormone replacement therapy. Patient denies post-menopausal vaginal bleeding. The patient wears seatbelts: yes. The patient participates in regular exercise: no but she is very active. 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 Has the patient ever been transfused or tattooed?: no. The patient reports that there is not domestic violence in her life.  Current complaints: 1.  She would like fasting blood work  2.  She reports that she has had much worse night sweats/hot flashes for the last few months. She prefers no prescriptions for this. 3.  She reports a decreased libido and would like to try test for this.   Gynecologic History Patient's last menstrual period was 05/05/2016. Menopause at 36-89 years old. Last Pap: 2024. Results were: normal and no h/o abnormal paps Last mammogram: 08/2023. Results were: normal Last Colonoscopy: 2023 and gets them every 5 years   FH- + colon cancer - father at 41 yo, no gyn, no breast   Obstetric History OB History  Gravida Para Term Preterm AB Living  4 4 4   4   SAB IAB Ectopic Multiple Live Births      4    # Outcome Date GA Lbr Len/2nd Weight Sex Type Anes PTL Lv  4 Term           3 Term           2 Term           1 Term             Past Medical History:  Diagnosis Date   Anemia    Migraine    Thyroid  disease     Family History  Problem Relation Age of Onset   Colon cancer Father 82   Breast cancer Neg Hx     Past Surgical History:  Procedure Laterality Date   COLONOSCOPY WITH PROPOFOL  N/A 05/07/2016   Procedure: COLONOSCOPY WITH PROPOFOL ;  Surgeon: Rogelia Copping, MD;   Location: [facility] ENDOSCOPY;  Service: Endoscopy;  Laterality: N/A;   COLONOSCOPY WITH PROPOFOL  N/A 05/14/2022   Procedure: COLONOSCOPY WITH PROPOFOL ;  Surgeon: Copping Rogelia, MD;  Location: [facility] ENDOSCOPY;  Service: Endoscopy;  Laterality: N/A;    Social History   Socioeconomic History   Marital status: Married    Spouse name: Not on file   Number of children: Not on file   Years of education: Not on file   Highest education level: Not on file  Occupational History   Not on file  Tobacco Use   Smoking status: Never   Smokeless tobacco: Never  Vaping Use   Vaping status: Never Used  Substance and Sexual Activity   Alcohol use: No   Drug use: No   Sexual activity: Yes    Birth control/protection: Post-menopausal  Other Topics Concern   Not on file  Social History Narrative   Not on file   Social Drivers of Health   Financial Resource Strain: Patient Declined (05/04/2023)   Received from [facility]   Overall Financial Resource Strain (CARDIA)    Difficulty of Paying Living Expenses: Patient declined  Food Insecurity: Patient Declined (05/04/2023)   Received from [facility]   Hunger Vital Sign    Within the past 12 months, you worried that your food would run out before you got the money to buy more.: Patient declined    Within the past 12 months, the food you bought just didn't last and you didn't have money to get more.: Patient declined  Transportation Needs: No Transportation Needs (05/04/2023)   Received from [facility] - Transportation    In the past 12 months, has lack of transportation kept you from medical appointments or from getting medications?: No    Lack of Transportation (Non-Medical): No  Physical Activity: Insufficiently Active (08/07/2018)   Received from [facility]   Exercise Vital Sign    On average, how many days per week do you engage in moderate to strenuous exercise (like a brisk  walk)?: 2 days    On average, how many minutes do you engage in exercise at this level?: 60 min  Stress: Stress Concern Present (08/07/2018)   Received from [facility] - Occupational Stress Questionnaire    Feeling of Stress : To some extent  Social Connections: Not on file  Intimate Partner Violence: Not on file    Current Outpatient Medications on File Prior to Visit  Medication Sig Dispense Refill   Cholecalciferol (VITAMIN D3) 10 MCG (400 UNIT) CAPS Take by mouth.     Melatonin 10 MG TABS Take by mouth.     traZODone  (DESYREL ) 50 MG tablet Take 1 tablet (50 mg total) by mouth at bedtime as needed for sleep. 30 tablet 3   Zinc 50 MG TABS Take by mouth daily.     meloxicam  (MOBIC ) 7.5 MG tablet Take 1 tablet (7.5 mg total) by mouth daily. (Patient not taking: Reported on 09/15/2023) 30 tablet 0   No current facility-administered medications on file prior to visit.    No Known Allergies    Review of Systems ROS Review of Systems - General ROS: negative for - chills, fatigue, fever, hot flashes, night sweats, weight gain or weight loss Psychological ROS: negative for - anxiety, decreased libido, depression, mood swings, physical abuse or sexual abuse Ophthalmic ROS: negative for - blurry vision, eye pain or loss of vision ENT ROS: negative for - headaches, hearing change, visual changes or vocal changes Allergy and Immunology ROS: negative for - hives, itchy/watery eyes or seasonal allergies Hematological and Lymphatic ROS: negative for - bleeding problems, bruising, swollen lymph nodes or weight loss Endocrine ROS: negative for - galactorrhea, hair pattern changes, hot flashes, malaise/lethargy, mood swings, palpitations, polydipsia/polyuria, skin changes, temperature intolerance or unexpected weight changes Breast ROS: negative for - new or changing breast lumps or nipple discharge Respiratory ROS: negative for - cough or shortness of  breath Cardiovascular ROS: negative for - chest pain, irregular heartbeat, palpitations or shortness of breath Gastrointestinal ROS: no abdominal pain, change in bowel habits, or black or bloody stools Genito-Urinary ROS: no dysuria, trouble voiding, or hematuria Musculoskeletal ROS: negative for - joint pain or joint stiffness Neurological ROS: negative for - bowel and bladder control changes Dermatological ROS: negative for rash and skin lesion changes   Objective:   BP 106/63   Pulse 80   Ht 5' 5.5 (1.664 m)   Wt 117 lb (53.1 kg)   LMP 05/05/2016   BMI 19.17 kg/m  CONSTITUTIONAL: Well-developed, well-nourished female in no  acute distress.  PSYCHIATRIC: Normal mood and affect. Normal behavior. Normal judgment and thought content. NEUROLGIC: Alert and oriented to person, place, and time. Normal muscle tone coordination. No cranial nerve deficit noted. HENT:  Normocephalic, atraumatic, External right and left ear normal. Oropharynx is clear and moist EYES: Conjunctivae and EOM are normal. Pupils are equal, round, and reactive to light. No scleral icterus.  NECK: Normal range of motion, supple, no masses.  Normal thyroid .  SKIN: Skin is warm and dry. No rash noted. Not diaphoretic. No erythema. No pallor. CARDIOVASCULAR: Normal heart rate noted, regular rhythm, no murmur. RESPIRATORY: Clear to auscultation bilaterally. Effort and breath sounds normal, no problems with respiration noted. BREASTS: Symmetric in size. No masses, skin changes, nipple drainage, or lymphadenopathy. ABDOMEN: Soft, normal bowel sounds, no distention noted.  No tenderness, rebound or guarding.  EG- moderate VVA Bimanual exam reveals a tiny mobile anteverted uterus and a palpable right ovarian mass   Labs: Lab Results  Component Value Date   WBC 5.5 06/04/2020   HGB 11.9 (L) 06/04/2020   HCT 36.4 06/04/2020   MCV 87.7 06/04/2020   PLT 249 06/04/2020    Lab Results  Component Value Date   CREATININE  0.71 03/18/2022   BUN 9 03/18/2022   NA 143 03/18/2022   K 4.6 03/18/2022   CL 104 03/18/2022   CO2 24 03/18/2022    Lab Results  Component Value Date   ALT 20 03/18/2022   AST 17 03/18/2022   ALKPHOS 89 03/18/2022   BILITOT 0.3 03/18/2022    Lab Results  Component Value Date   CHOL 192 03/18/2022   HDL 50 03/18/2022   LDLCALC 123 (H) 03/18/2022   TRIG 108 03/18/2022   CHOLHDL 3.8 03/18/2022    Lab Results  Component Value Date   TSH 2.840 02/19/2022    Lab Results  Component Value Date   HGBA1C 5.7 (H) 03/18/2022     Assessment:   1. Need for Tdap vaccination   2. Encounter for annual routine gynecological examination   3.      Decreased libido 4.      Night sweats 5.      Palpable right ovary  Plan:  Check pelvic ultrasound Trial of test. Gel At the present she will hold off on HRT but may change her mind I refilled her trazadone. Preventative labs drawn. Return to Clinic - 1 Year   Harland JAYSON Birkenhead, MD [facility]

## 2023-09-16 ENCOUNTER — Encounter: Payer: Self-pay | Admitting: Obstetrics & Gynecology

## 2023-09-16 LAB — LIPID PANEL
Chol/HDL Ratio: 3.9 ratio (ref 0.0–4.4)
Cholesterol, Total: 236 mg/dL — ABNORMAL HIGH (ref 100–199)
HDL: 61 mg/dL (ref >39)
LDL Chol Calc (NIH): 160 mg/dL — ABNORMAL HIGH (ref 0–99)
Triglycerides: 87 mg/dL (ref 0–149)
VLDL Cholesterol Cal: 15 mg/dL (ref 5–40)

## 2023-09-16 LAB — COMPREHENSIVE METABOLIC PANEL WITH GFR
ALT: 13 IU/L (ref 0–32)
AST: 19 IU/L (ref 0–40)
Albumin: 4.7 g/dL (ref 3.8–4.9)
Alkaline Phosphatase: 99 IU/L (ref 44–121)
BUN/Creatinine Ratio: 12 (ref 9–23)
BUN: 10 mg/dL (ref 6–24)
Bilirubin Total: 0.4 mg/dL (ref 0.0–1.2)
CO2: 23 mmol/L (ref 20–29)
Calcium: 10.2 mg/dL (ref 8.7–10.2)
Chloride: 100 mmol/L (ref 96–106)
Creatinine, Ser: 0.84 mg/dL (ref 0.57–1.00)
Globulin, Total: 2.1 g/dL (ref 1.5–4.5)
Glucose: 89 mg/dL (ref 70–99)
Potassium: 4.7 mmol/L (ref 3.5–5.2)
Sodium: 140 mmol/L (ref 134–144)
Total Protein: 6.8 g/dL (ref 6.0–8.5)
eGFR: 82 mL/min/1.73 (ref >59)

## 2023-09-16 LAB — CBC
Hematocrit: 44 % (ref 34.0–46.6)
Hemoglobin: 13.9 g/dL (ref 11.1–15.9)
MCH: 29 pg (ref 26.6–33.0)
MCHC: 31.6 g/dL (ref 31.5–35.7)
MCV: 92 fL (ref 79–97)
Platelets: 298 x10E3/uL (ref 150–450)
RBC: 4.79 x10E6/uL (ref 3.77–5.28)
RDW: 12.9 % (ref 11.7–15.4)
WBC: 7.5 x10E3/uL (ref 3.4–10.8)

## 2023-09-16 LAB — HEMOGLOBIN A1C
Est. average glucose Bld gHb Est-mCnc: 111 mg/dL
Hgb A1c MFr Bld: 5.5 % (ref 4.8–5.6)

## 2023-09-16 LAB — TSH+FREE T4
Free T4: 1 ng/dL (ref 0.82–1.77)
TSH: 2.66 u[IU]/mL (ref 0.450–4.500)

## 2023-09-23 DIAGNOSIS — Z419 Encounter for procedure for purposes other than remedying health state, unspecified: Secondary | ICD-10-CM | POA: Diagnosis not present

## 2023-10-02 ENCOUNTER — Encounter: Admitting: Family Medicine

## 2023-10-09 ENCOUNTER — Ambulatory Visit

## 2023-10-09 DIAGNOSIS — N838 Other noninflammatory disorders of ovary, fallopian tube and broad ligament: Secondary | ICD-10-CM

## 2023-10-20 ENCOUNTER — Ambulatory Visit: Admitting: Obstetrics

## 2023-10-20 ENCOUNTER — Encounter: Payer: Self-pay | Admitting: Obstetrics

## 2023-10-20 VITALS — BP 99/62 | HR 86 | Ht 65.5 in | Wt 117.2 lb

## 2023-10-20 DIAGNOSIS — D259 Leiomyoma of uterus, unspecified: Secondary | ICD-10-CM

## 2023-10-20 DIAGNOSIS — R9389 Abnormal findings on diagnostic imaging of other specified body structures: Secondary | ICD-10-CM | POA: Insufficient documentation

## 2023-10-20 NOTE — Progress Notes (Signed)
    GYNECOLOGY PROGRESS NOTE  Subjective:  PCP: Sharma Coyer, MD  Patient ID: Jasmin Christian, female    DOB: May 04, 1970, 53 y.o.   MRN: [id number]  HPI Patient is a 53 y.o. G23P4004 female who presents for US  results. Saw Dr. Starla 09/15/23 for her annual, right ovary was 'palpable' on exam, pelvic US  obtained and is here to discuss the results. US  showed multiple fibroids and pt is asymptomatic. LMP 05/05/2016 and denies any bleeding since. No pelvic symptoms. Her ovaries were bilaterally normal appearing.  The following portions of the patient's history were reviewed and updated as appropriate: allergies, current medications, past family history, past medical history, past social history, past surgical history, and problem list.  Review of Systems Pertinent items are noted in HPI.   Objective:   Blood pressure 99/62, pulse 86, height 5' 5.5 (1.664 m), weight 117 lb 3.2 oz (53.2 kg), last menstrual period 05/05/2016. Body mass index is 19.21 kg/m.  General appearance: alert, cooperative, and no distress Pelvic: deferred Extremities: extremities normal, atraumatic, no cyanosis or edema Neurologic: Grossly normal  Indications:Palpable right ovary on a post menopausal woman. Findings:  The uterus is anteverted and measures 7.3 x 2.7 x 4.2 cm with a uterine volume of 42.82 ml. Echo texture is heterogenous with evidence of focal masses. Within the uterus are multiple suspected fibroids. The largest three measuring: Fibroid 1: 3 x 3 x 3 mm (adjacent to endometrium). Fibroid 2: 2.0 x 1.3 x 2.1 cm (posterior uterine body). Fibroid 3: 9 x 5 x 8 mm (posterior uterine body).    The Endometrium measures 1.4 mm.   Right Ovary measures 2.2 x 1.4 x 1.4 cm. It is normal in appearance. Left Ovary measures 2.2 x 1.4 x 1.8 cm. It is normal in appearance. Survey of the adnexa demonstrates no adnexal masses. There is no free fluid in the cul de sac.   Impression: 1. Fibroid uterus  seen. 2. Few dilated veins seen in left adnexa.   Assessment/Plan:   1. Uterine leiomyoma, unspecified location    We discussed expectant management since she is post-menopausal and without symptoms of fibroids. She is in agreement with this plan.   Follow up in 13yr for annual, sooner prn.    Estil Mangle, DO [facility]

## 2023-10-24 DIAGNOSIS — Z419 Encounter for procedure for purposes other than remedying health state, unspecified: Secondary | ICD-10-CM | POA: Diagnosis not present

## 2023-11-20 ENCOUNTER — Ambulatory Visit: Admitting: Physician Assistant

## 2023-11-20 ENCOUNTER — Encounter: Payer: Self-pay | Admitting: Physician Assistant

## 2023-11-20 ENCOUNTER — Ambulatory Visit: Payer: Self-pay

## 2023-11-20 VITALS — BP 105/61 | HR 91 | Temp 98.2°F | Resp 16 | Ht 65.5 in | Wt 117.6 lb

## 2023-11-20 DIAGNOSIS — R0989 Other specified symptoms and signs involving the circulatory and respiratory systems: Secondary | ICD-10-CM | POA: Diagnosis not present

## 2023-11-20 NOTE — Telephone Encounter (Signed)
 FYI Only or Action Required?: FYI only for provider.  Patient was last seen in primary care on 08/14/2023 by Sharma Coyer, MD.  Called Nurse Triage reporting Cough.  Symptoms began several days ago.  Interventions attempted: OTC medications: Cough syrup.  Symptoms are: gradually worsening.  Triage Disposition: See Physician Within 24 Hours  Patient/caregiver understands and will follow disposition?: Yes     Copied from CRM #[id number]. Topic: Clinical - Red Word Triage >> Nov 20, 2023  8:46 AM Wess RAMAN wrote: Red Word that prompted transfer to Nurse Triage: Cold and congestion since Friday. Has lost her voice. Took OTC covid test and it was negative. Bad cough Reason for Disposition  [1] Continuous (nonstop) coughing interferes with work or school AND [2] no improvement using cough treatment per Care Advice  Answer Assessment - Initial Assessment Questions Patient took otc covid test that was negative. Currently taking otc liquid cough meds for symptoms.    1. ONSET: When did the cough begin?      Friday  2. SEVERITY: How bad is the cough today?      Moderate  3. SPUTUM: Describe the color of your sputum (e.g., none, dry cough; clear, white, yellow, green)     Yellow  4. HEMOPTYSIS: Are you coughing up any blood? If Yes, ask: How much? (e.g., flecks, streaks, tablespoons, etc.)     Denies  5. DIFFICULTY BREATHING: Are you having difficulty breathing? If Yes, ask: How bad is it? (e.g., mild, moderate, severe)      Denies  6. FEVER: Do you have a fever? If Yes, ask: What is your temperature, how was it measured, and when did it start?     Denies  7. OTHER SYMPTOMS: Do you have any other symptoms? (e.g., runny nose, wheezing, chest pain)       Loss of voice, cold and congestion  Protocols used: Cough - Acute Productive-A-AH

## 2023-11-20 NOTE — Progress Notes (Signed)
 Established patient visit  Patient: Jasmin Christian   DOB: [birth year]   53 y.o. Female  MRN: [id number] Visit Date: 11/20/2023  Today's healthcare provider: Jolynn Spencer, PA-C   Chief Complaint  Patient presents with   Cough    Pt presents with cold and congestion started Thursday. Has lost her voice. Took OTC covid test on saturday and it was negative. Bad cough otc: equate brand nyquil/dayquil, also using vaporizing machine at night, cough tablets. Ear fullness.   Subjective     HPI     Cough    Additional comments: Pt presents with cold and congestion started Thursday. Has lost her voice. Took OTC covid test on saturday and it was negative. Bad cough otc: equate brand nyquil/dayquil, also using vaporizing machine at night, cough tablets. Ear fullness.      Last edited by Wilfred Hargis GORMAN, CMA on 11/20/2023 11:08 AM.       Discussed the use of AI scribe software for clinical note transcription with the patient, who gave verbal consent to proceed.  History of Present Illness Jasmin Christian is a 53 year old female who presents with congestion and cough.  She experiences significant congestion and a stuffy head, impairing her hearing. She feels the need to 'pop' her ears and believes breaking up the mucus would provide relief. She has a cough with a small amount of phlegm. Over-the-counter liquid cold medications like Nyquil and DayQuil help suppress her cough, but it returns after about five hours without medication. She has not been taking any antihistamines. There are no headaches, sinus pressure, or acid reflux. Her tongue is red, and her throat is slightly pinkish, possibly due to medication.  She manages her symptoms by drinking plenty of water and using home remedies like honey and lemon juice. She has not been taking Tylenol or NSAIDs, as her cold medicine already contains these components. Her mother has significant sinus issues.       08/14/2023    2:29 PM 03/18/2022    10:35 AM 02/19/2022   10:35 AM  Depression screen PHQ 2/9  Decreased Interest 0 0 0  Down, Depressed, Hopeless 0 0 0  PHQ - 2 Score 0 0 0  Altered sleeping 0 0 0  Tired, decreased energy 0 0 0  Change in appetite 0 0 0  Feeling bad or failure about yourself  0 0 0  Trouble concentrating 0 0 0  Moving slowly or fidgety/restless 0 0 0  Suicidal thoughts 0 0 0  PHQ-9 Score 0 0 0  Difficult doing work/chores Not difficult at all Not difficult at all Not difficult at all      08/14/2023    2:29 PM  GAD 7 : Generalized Anxiety Score  Nervous, Anxious, on Edge 0  Control/stop worrying 0  Worry too much - different things 0  Trouble relaxing 0  Restless 0  Easily annoyed or irritable 0  Afraid - awful might happen 0  Total GAD 7 Score 0  Anxiety Difficulty Not difficult at all    Medications: Outpatient Medications Prior to Visit  Medication Sig   Cholecalciferol (VITAMIN D3) 10 MCG (400 UNIT) CAPS Take by mouth.   Melatonin 10 MG TABS Take by mouth.   Testosterone  1.62 % GEL Place pea-size amount to wrist 2 nights per week   traZODone  (DESYREL ) 50 MG tablet Take 1 tablet (50 mg total) by mouth at bedtime as needed for sleep.   Zinc 50 MG TABS Take by  mouth daily.   [DISCONTINUED] meloxicam  (MOBIC ) 7.5 MG tablet Take 1 tablet (7.5 mg total) by mouth daily.   No facility-administered medications prior to visit.    Review of Systems All negative Except see HPI       Objective    BP 105/61   Pulse 91   Temp 98.2 F (36.8 C) (Oral)   Resp 16   Ht 5' 5.5 (1.664 m)   Wt 117 lb 9.6 oz (53.3 kg)   LMP 05/05/2016   SpO2 100%   BMI 19.27 kg/m     Physical Exam Vitals reviewed.  Constitutional:      Appearance: She is normal weight.  HENT:     Head: Normocephalic and atraumatic.     Right Ear: Ear canal and external ear normal.     Left Ear: Ear canal and external ear normal.     Nose: Congestion and rhinorrhea present.     Mouth/Throat:     Pharynx: Posterior  oropharyngeal erythema present.     Comments: Postnasal drainage noted Eyes:     General: No scleral icterus.       Right eye: No discharge.        Left eye: No discharge.     Extraocular Movements: Extraocular movements intact.     Pupils: Pupils are equal, round, and reactive to light.  Cardiovascular:     Rate and Rhythm: Normal rate and regular rhythm.  Pulmonary:     Effort: Pulmonary effort is normal.     Breath sounds: Normal breath sounds.  Abdominal:     General: Abdomen is flat. Bowel sounds are normal.     Palpations: Abdomen is soft.  Lymphadenopathy:     Cervical: No cervical adenopathy.  Neurological:     Mental Status: She is alert.      No results found for any visits on 11/20/23.       Assessment & Plan   Respiratory symptoms (Primary)  Acute upper respiratory infection vs sinusitis Viral upper respiratory infection with nasal congestion, cough, and phlegm. No fever. Possible allergy exacerbation. No asthma, COPD, or smoking history. Possible tonsil stones. - Symptomatic treatment: warm salt gargles, increased fluid intake, hot tea with honey. - Nasal saline spray and Flonase for congestion. - Over-the-counter antihistamines: Allegra, Claritin, or Zyrtec. - Mucinex for cough transition. - Tylenol or NSAIDs for pain and inflammation, alternating every 4-6 hours. - Follow-up if symptoms persist beyond 10 days or worsen to rule out bacterial infection.    No orders of the defined types were placed in this encounter.   No follow-ups on file.   The patient was advised to call back or seek an in-person evaluation if the symptoms worsen or if the condition fails to improve as anticipated.  I discussed the assessment and treatment plan with the patient. The patient was provided an opportunity to ask questions and all were answered. The patient agreed with the plan and demonstrated an understanding of the instructions.  I, Leonte Horrigan, PA-C have reviewed  all documentation for this visit. The documentation on 11/20/2023  for the exam, diagnosis, procedures, and orders are all accurate and complete.  Jolynn Spencer, [facility], MMS [facility] [phone] (phone) [phone] (fax)  [facility]

## 2023-12-24 DIAGNOSIS — Z419 Encounter for procedure for purposes other than remedying health state, unspecified: Secondary | ICD-10-CM | POA: Diagnosis not present

## 2023-12-30 ENCOUNTER — Ambulatory Visit: Admitting: Family Medicine

## 2023-12-30 ENCOUNTER — Encounter: Payer: Self-pay | Admitting: Family Medicine

## 2023-12-30 VITALS — BP 109/56 | HR 75 | Temp 97.9°F | Ht 65.5 in | Wt 118.7 lb

## 2023-12-30 DIAGNOSIS — J3089 Other allergic rhinitis: Secondary | ICD-10-CM

## 2023-12-30 MED ORDER — FLUTICASONE PROPIONATE 50 MCG/ACT NA SUSP: 2.0000 | Freq: Every day | NASAL | 6 refills | Status: AC

## 2023-12-30 MED ORDER — CETIRIZINE HCL 10 MG PO TABS: 10.0000 mg | ORAL_TABLET | Freq: Every day | ORAL | 11 refills | Status: AC

## 2023-12-30 NOTE — Progress Notes (Signed)
 ACUTE VISIT   Patient: Jasmin Christian   DOB: 12-01-1970   53 y.o. Female  MRN: [id number]   PCP: Sharma Coyer, MD  Chief Complaint  Patient presents with   Sinusitis    Onset x one month, had cough/ laryngitis beginning of October. Now has lots of sinus drainage. Laryngitis has resolved but has lots of drainage    Subjective    HPI HPI     Sinusitis    Additional comments: Onset x one month, had cough/ laryngitis beginning of October. Now has lots of sinus drainage. Laryngitis has resolved but has lots of drainage       Last edited by Cherry Chiquita HERO, CMA on 12/30/2023 12:58 PM.       Discussed the use of AI scribe software for clinical note transcription with the patient, who gave verbal consent to proceed.  History of Present Illness Jasmin Christian is a 53 year old female who presents with persistent postnasal drip and sinus drainage following a recent upper respiratory infection.  Her symptoms began one month ago with a cough and laryngitis in early October. The laryngitis has since resolved, but she continues to experience significant postnasal drip and sinus drainage. She describes the sensation as 'drowning in this drip, drip, drip stuff.'  She was previously treated for acute sinusitis with amoxicillin 875 mg twice a day for seven days. Her symptoms are particularly noticeable in certain environments, such as church, which she initially attributed to air fresheners.  She notes occasional itchy eyes after cleaning around dust, but she does not regularly experience itchy eyes or ears. Her current medications include testosterone , vitamin D, trazodone , and zinc. She has no known drug allergies and is not currently taking any allergy medications, although she has used Zyrtec in the past for seasonal allergies.  She has a family history of sinus problems, as her mother has had chronic sinus issues.   Medications: Outpatient Medications Prior to Visit   Medication Sig   Cholecalciferol (VITAMIN D3) 10 MCG (400 UNIT) CAPS Take by mouth.   Melatonin 10 MG TABS Take by mouth.   Testosterone  1.62 % GEL Place pea-size amount to wrist 2 nights per week   traZODone  (DESYREL ) 50 MG tablet Take 1 tablet (50 mg total) by mouth at bedtime as needed for sleep.   Zinc 50 MG TABS Take by mouth daily.   No facility-administered medications prior to visit.    Last CBC Lab Results  Component Value Date   WBC 7.5 09/15/2023   HGB 13.9 09/15/2023   HCT 44.0 09/15/2023   MCV 92 09/15/2023   MCH 29.0 09/15/2023   RDW 12.9 09/15/2023   PLT 298 09/15/2023   Last metabolic panel Lab Results  Component Value Date   GLUCOSE 89 09/15/2023   NA 140 09/15/2023   K 4.7 09/15/2023   CL 100 09/15/2023   CO2 23 09/15/2023   BUN 10 09/15/2023   CREATININE 0.84 09/15/2023   EGFR 82 09/15/2023   CALCIUM 10.2 09/15/2023   PROT 6.8 09/15/2023   ALBUMIN 4.7 09/15/2023   LABGLOB 2.1 09/15/2023   AGRATIO 1.7 03/18/2022   BILITOT 0.4 09/15/2023   ALKPHOS 99 09/15/2023   AST 19 09/15/2023   ALT 13 09/15/2023   ANIONGAP 9 06/04/2020   Last lipids Lab Results  Component Value Date   CHOL 236 (H) 09/15/2023   HDL 61 09/15/2023   LDLCALC 160 (H) 09/15/2023   TRIG 87 09/15/2023  CHOLHDL 3.9 09/15/2023   Last hemoglobin A1c Lab Results  Component Value Date   HGBA1C 5.5 09/15/2023   Last thyroid  functions Lab Results  Component Value Date   TSH 2.660 09/15/2023   FREET4 1.00 09/15/2023   Last vitamin D No results found for: 25OHVITD2, 25OHVITD3, VD25OH Last vitamin B12 and Folate No results found for: VITAMINB12, FOLATE      Objective    BP (!) 109/56 (BP Location: Right Arm, Patient Position: Sitting, Cuff Size: Normal)   Pulse 75   Temp 97.9 F (36.6 C) (Oral)   Ht 5' 5.5 (1.664 m)   Wt 118 lb 11.2 oz (53.8 kg)   LMP 05/05/2016   SpO2 100%   BMI 19.45 kg/m   BP Readings from Last 3 Encounters:  12/30/23 (!) 109/56   11/20/23 105/61  10/20/23 99/62   Wt Readings from Last 3 Encounters:  12/30/23 118 lb 11.2 oz (53.8 kg)  11/20/23 117 lb 9.6 oz (53.3 kg)  10/20/23 117 lb 3.2 oz (53.2 kg)      Physical Exam   Physical Exam VITALS: P- 75, SaO2- 100% GENERAL: Alert, well developed, no acute distress. HEENT: Normocephalic, normal tympanic membranes bilaterally, no frontal or maxillary sinus tenderness, scant rhinorrhea in the right nostril, normal oropharynx, moist mucous membranes. CHEST: No respiratory distress.   No results found for any visits on 12/30/23.   Assessment & Plan     Assessment and Plan Assessment & Plan Allergic rhinitis Chronic postnasal drip and sinus drainage for one month, likely due to allergic rhinitis. Symptoms include significant mucus production and occasional itchy eyes, exacerbated by environmental triggers such as dust. Differential includes sinusitis, but lack of tenderness suggests allergic rhinitis as the primary issue. - Prescribed Zyrtec 10 mg daily. - Instructed to start Flonase nasal spray, two sprays in each nostril daily. - Advised to monitor symptoms for 10 days and reassess if no improvement.      Return in about 10 days (around 01/09/2024), or if symptoms worsen or fail to improve.        Rockie Agent, MD  [facility] [phone] (phone) [phone] (fax)  [facility]

## 2023-12-30 NOTE — Patient Instructions (Signed)
 To keep you healthy, please keep in mind the following health maintenance items that you are due for:   Health Maintenance Due  Topic Date Due   HIV Screening  Never done   Hepatitis C Screening  Never done   Hepatitis B Vaccines 19-59 Average Risk (1 of 3 - 19+ 3-dose series) Never done   Pneumococcal Vaccine: 50+ Years (1 of 1 - PCV) Never done   Zoster Vaccines- Shingrix (1 of 2) Never done     Best Wishes,   Dr. Lang

## 2024-02-19 ENCOUNTER — Ambulatory Visit: Payer: Self-pay

## 2024-02-20 ENCOUNTER — Ambulatory Visit
Admission: RE | Admit: 2024-02-20 | Discharge: 2024-02-20 | Disposition: A | Discharge status: Home / Self Care | Attending: Emergency Medicine

## 2024-02-20 VITALS — BP 91/64 | HR 100 | Temp 98.8°F | Resp 18

## 2024-02-20 DIAGNOSIS — J101 Influenza due to other identified influenza virus with other respiratory manifestations: Secondary | ICD-10-CM

## 2024-02-20 DIAGNOSIS — R051 Acute cough: Secondary | ICD-10-CM

## 2024-02-20 LAB — POCT INFLUENZA A/B
Influenza A, POC: POSITIVE — AB
Influenza B, POC: NEGATIVE

## 2024-02-20 LAB — POC SOFIA SARS ANTIGEN FIA: SARS Coronavirus 2 Ag: NEGATIVE

## 2024-02-20 MED ORDER — PROMETHAZINE-DM 6.25-15 MG/5ML PO SYRP: 5.0000 mL | ORAL_SOLUTION | Freq: Four times a day (QID) | ORAL | 0 refills | Status: AC | PRN

## 2024-02-20 NOTE — ED Provider Notes (Signed)
 " Jasmin Christian    CSN: [id number] Arrival date & time: 02/20/24  1547      History   Chief Complaint Chief Complaint  Patient presents with   Cough    I have been running a fever on & off since Wednesday morning. Also have a cough. Was around mom on 12/31 who found out later she had Flu A. - Entered by patient    HPI [name] is a 54 y.o. female.  Patient presents with 2 day history of fever, congestion, ear fullness, cough.  She reports 1 loose stool today.  Tmax 101.4.  She has been treating her symptoms with OTC cold medication.  No shortness of breath or vomiting.  She was recently exposed to influenza A.  The history is provided by the patient and medical records.    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   Skin lesion of chest wall 03/18/2022   Multiple thyroid  nodules 02/19/2022   Menopausal symptoms 02/19/2022   Screening for colon cancer 02/19/2022   History of adenomatous polyp of colon 05/07/2016   Family history of colon cancer 02/20/2016   Thyroid  nodule 02/20/2016    Past Surgical History:  Procedure Laterality Date   COLONOSCOPY WITH PROPOFOL  N/A 05/07/2016   Procedure: COLONOSCOPY WITH PROPOFOL ;  Surgeon: Rogelia Copping, MD;  Location: [facility] ENDOSCOPY;  Service: Endoscopy;  Laterality: N/A;   COLONOSCOPY WITH PROPOFOL  N/A 05/14/2022   Procedure: COLONOSCOPY WITH PROPOFOL ;  Surgeon: Copping Rogelia, MD;  Location: [facility] ENDOSCOPY;  Service: Endoscopy;  Laterality: N/A;    OB History     Gravida   4   Para  4   Term  4   Preterm      AB      Living  4      SAB      IAB      Ectopic      Multiple      Live Births  4            Home Medications    Prior to Admission medications  Medication Sig Start Date End Date Taking? Authorizing Provider  promethazine -dextromethorphan (PROMETHAZINE -DM) 6.25-15 MG/5ML syrup Take 5 mLs by mouth 4 (four) times daily as needed. 02/20/24  Yes Corlis Burnard DEL, NP  cetirizine  (ZYRTEC ) 10 MG tablet Take 1 tablet (10 mg total) by mouth daily. 12/30/23   Simmons-Robinson, Makiera, MD  Cholecalciferol (VITAMIN D3) 10 MCG (400 UNIT) CAPS Take by mouth.    [provider]  fluticasone  (FLONASE ) 50 MCG/ACT nasal spray Place 2 sprays into both nostrils daily. 12/30/23   Simmons-Robinson, Rockie, MD  Melatonin 10 MG TABS Take by mouth.    [provider]  Testosterone  1.62 % GEL Place pea-size amount to wrist 2 nights per week 09/15/23   Starla Harland BROCKS, MD  traZODone  (DESYREL ) 50 MG tablet Take 1 tablet (50 mg total) by mouth at bedtime as needed for sleep. 09/15/23   Dove, Myra C, MD  Zinc 50  MG TABS Take by mouth daily.    [provider]    Family History Family History  Problem Relation Age of Onset   Colon cancer Father 25   Breast cancer Neg Hx     Social History Social History[1]   Allergies   Patient has no known allergies.   Review of Systems Review of Systems  Constitutional:  Positive for fever. Negative for chills.  HENT:  Positive for congestion and ear pain. Negative for sore throat.   Respiratory:  Positive for cough. Negative for shortness of breath.   Gastrointestinal:  Negative for abdominal pain, diarrhea and vomiting.     Physical Exam Triage Vital Signs ED Triage Vitals  Encounter Vitals Group     BP      Girls Systolic BP Percentile      Girls Diastolic BP Percentile      Boys Systolic BP Percentile      Boys Diastolic BP Percentile      Pulse      Resp      Temp       Temp src      SpO2      Weight      Height      Head Circumference      Peak Flow      Pain Score      Pain Loc      Pain Education      Exclude from Growth Chart    No data found.  Updated Vital Signs BP 91/64   Pulse 100   Temp 98.8 F (37.1 C)   Resp 18   LMP 05/05/2016   SpO2 98%   Visual Acuity Right Eye Distance:   Left Eye Distance:   Bilateral Distance:    Right Eye Near:   Left Eye Near:    Bilateral Near:     Physical Exam Constitutional:      General: She is not in acute distress. HENT:     Right Ear: Tympanic membrane normal.     Left Ear: Tympanic membrane normal.     Nose: Nose normal.     Mouth/Throat:     Mouth: Mucous membranes are moist.     Pharynx: Oropharynx is clear.  Cardiovascular:     Rate and Rhythm: Normal rate and regular rhythm.     Heart sounds: Normal heart sounds.  Pulmonary:     Effort: Pulmonary effort is normal. No respiratory distress.     Breath sounds: Normal breath sounds.  Neurological:     Mental Status: She is alert.      UC Treatments / Results  Labs (all labs ordered are listed, but only abnormal results are displayed) Labs Reviewed  POCT INFLUENZA A/B - Abnormal; Notable for the following components:      Result Value   Influenza A, POC Positive (*)    All other components within normal limits  POC SOFIA SARS ANTIGEN FIA - Normal    EKG   Radiology No results found.  Procedures Procedures (including critical care time)  Medications Ordered in UC Medications - No data to display  Initial Impression / Assessment and Plan / UC Course  I have reviewed the triage vital signs and the nursing notes.  Pertinent labs & imaging results that were available during my care of the patient were reviewed by me and considered in my medical decision making (see chart for details).    Cough, influenza A.  Afebrile and vital  signs are stable.  Lungs are clear and O2 sat is 98% on room air.  Rapid flu positive  for influenza A.  COVID-negative.  Patient declines antiviral flu medication.  Treating her cough with Promethazine  DM.  Precautions for drowsiness with promethazine  discussed.  Tylenol as needed, rest, hydration.  Instructed her to follow-up with her PCP.  ED precautions given.  Education provided on influenza.  She agrees to plan of care.  Final Clinical Impressions(s) / UC Diagnoses   Final diagnoses:  Acute cough  Influenza A     Discharge Instructions      Your flu test is positive for influenza A.  COVID test is negative.    Take the promethazine  DM as directed for cough.  Do not drive, operate machinery, drink alcohol, or perform dangerous activities while taking this medication as it may cause drowsiness.  Follow up with your primary care provider.  Go to the emergency department if you have worsening symptoms.        ED Prescriptions     Medication Sig Dispense Auth. Provider   promethazine -dextromethorphan (PROMETHAZINE -DM) 6.25-15 MG/5ML syrup Take 5 mLs by mouth 4 (four) times daily as needed. 118 mL Corlis Burnard DEL, NP      I have reviewed the PDMP during this encounter.    [1]  Social History Tobacco Use   Smoking status: Never   Smokeless tobacco: Never  Vaping Use   Vaping status: Never Used  Substance Use Topics   Alcohol use: No   Drug use: No     Corlis Burnard DEL, NP 02/20/24 1637  "

## 2024-02-20 NOTE — Discharge Instructions (Addendum)
 Your flu test is positive for influenza A.  COVID test is negative.    Take the promethazine  DM as directed for cough.  Do not drive, operate machinery, drink alcohol, or perform dangerous activities while taking this medication as it may cause drowsiness.  Follow up with your primary care provider.  Go to the emergency department if you have worsening symptoms.

## 2024-02-20 NOTE — ED Triage Notes (Signed)
 Patient to Urgent Care with complaints of cough/ intermittent fevers (101.4)/ ear fullness/ nasal congestion/ one episode of diarrhea.  Symptoms started Tuesday. Exposed to flu on December 31st.   Using otc cold and flu medicine.
